# Patient Record
Sex: Male | Born: 1980 | Race: White | Hispanic: No | Marital: Single | Smoking: Current every day smoker
Health system: Southern US, Community
[De-identification: ages and names within clinical notes are randomized; demographics above are authoritative.]

## PROBLEM LIST (undated history)

## (undated) DIAGNOSIS — N200 Calculus of kidney: Secondary | ICD-10-CM

## (undated) HISTORY — PX: OTHER SURGICAL HISTORY: SHX169

---
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
## 2003-03-31 ENCOUNTER — Emergency Department (HOSPITAL_COMMUNITY): Admission: EM | Admit: 2003-03-31 | Discharge: 2003-04-01 | Payer: Self-pay | Admitting: *Deleted

## 2005-10-09 ENCOUNTER — Emergency Department (HOSPITAL_COMMUNITY): Admission: EM | Admit: 2005-10-09 | Discharge: 2005-10-09 | Payer: Self-pay | Admitting: Emergency Medicine

## 2006-03-14 ENCOUNTER — Emergency Department (HOSPITAL_COMMUNITY): Admission: EM | Admit: 2006-03-14 | Discharge: 2006-03-14 | Payer: Self-pay | Admitting: Emergency Medicine

## 2006-08-15 HISTORY — PX: WRIST FRACTURE SURGERY: SHX121

## 2007-04-22 ENCOUNTER — Emergency Department (HOSPITAL_COMMUNITY): Admission: EM | Admit: 2007-04-22 | Discharge: 2007-04-23 | Payer: Self-pay | Admitting: Emergency Medicine

## 2007-04-30 ENCOUNTER — Ambulatory Visit (HOSPITAL_BASED_OUTPATIENT_CLINIC_OR_DEPARTMENT_OTHER): Admission: RE | Admit: 2007-04-30 | Discharge: 2007-05-01 | Payer: Self-pay | Admitting: Orthopedic Surgery
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
## 2008-08-19 IMAGING — CR DG ELBOW COMPLETE 3+V*L*
4 series · 4 of 4 positions shown · non-contrast
Comparison: none

CLINICAL DATA: Wrist fracture.  Also with proximal pain. 
 LEFT FOREARM ? 2 VIEW:

[x elbow joint lat left]
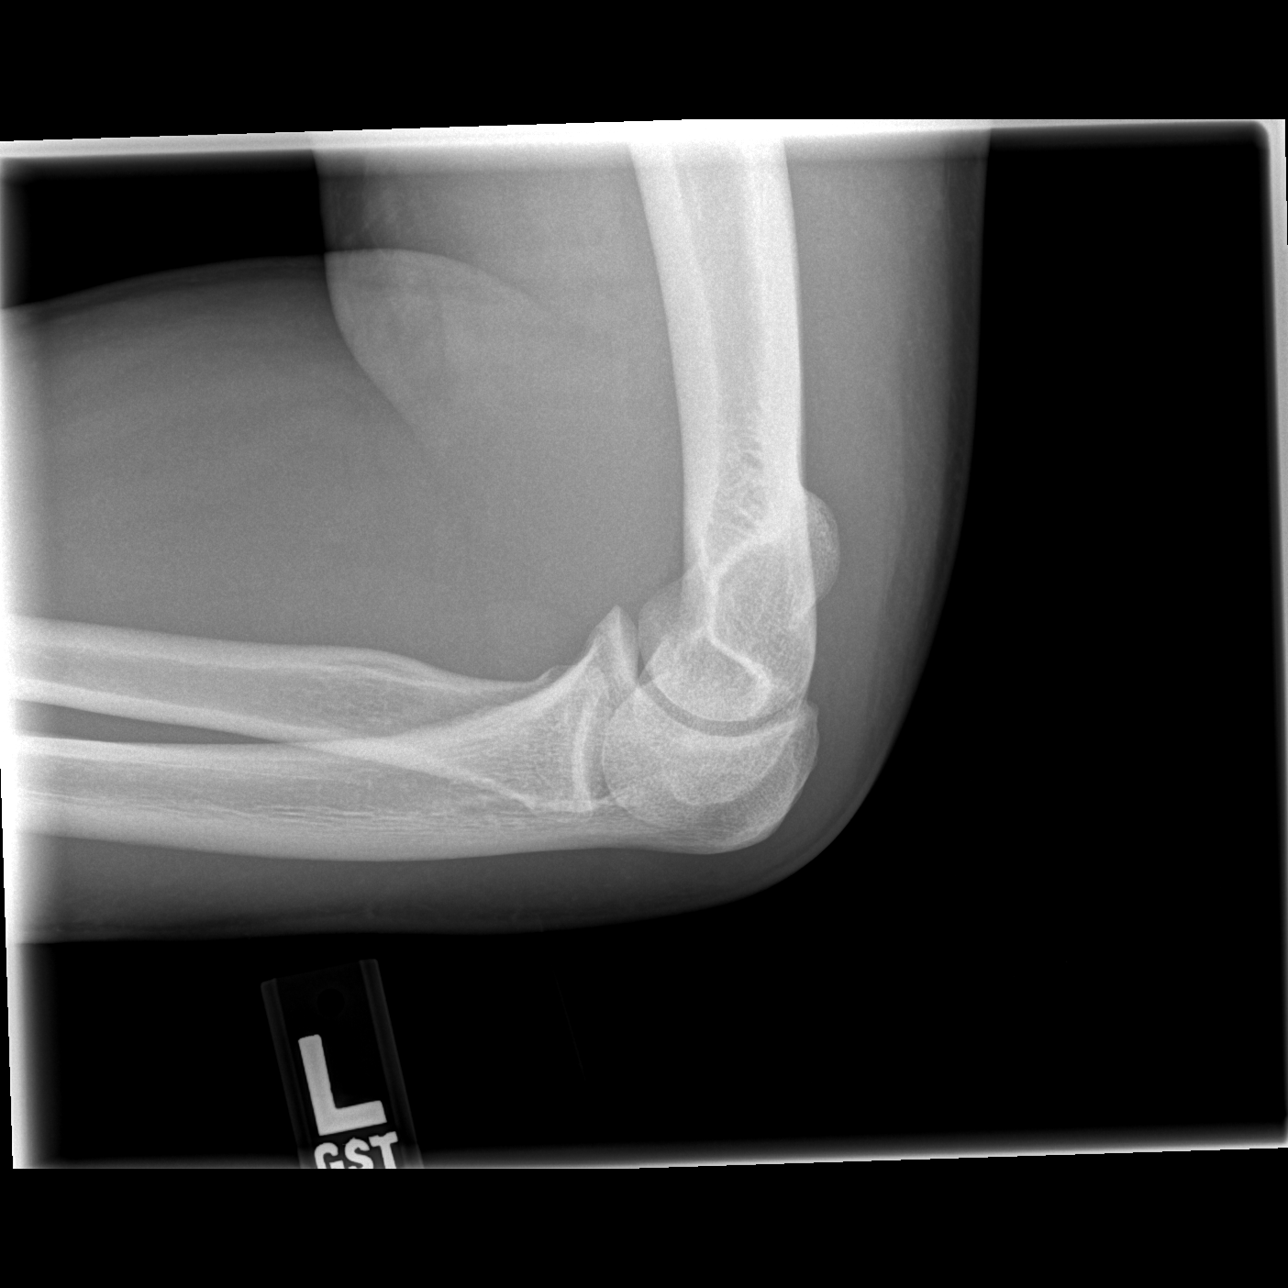

[t elbow (1 of 3)]
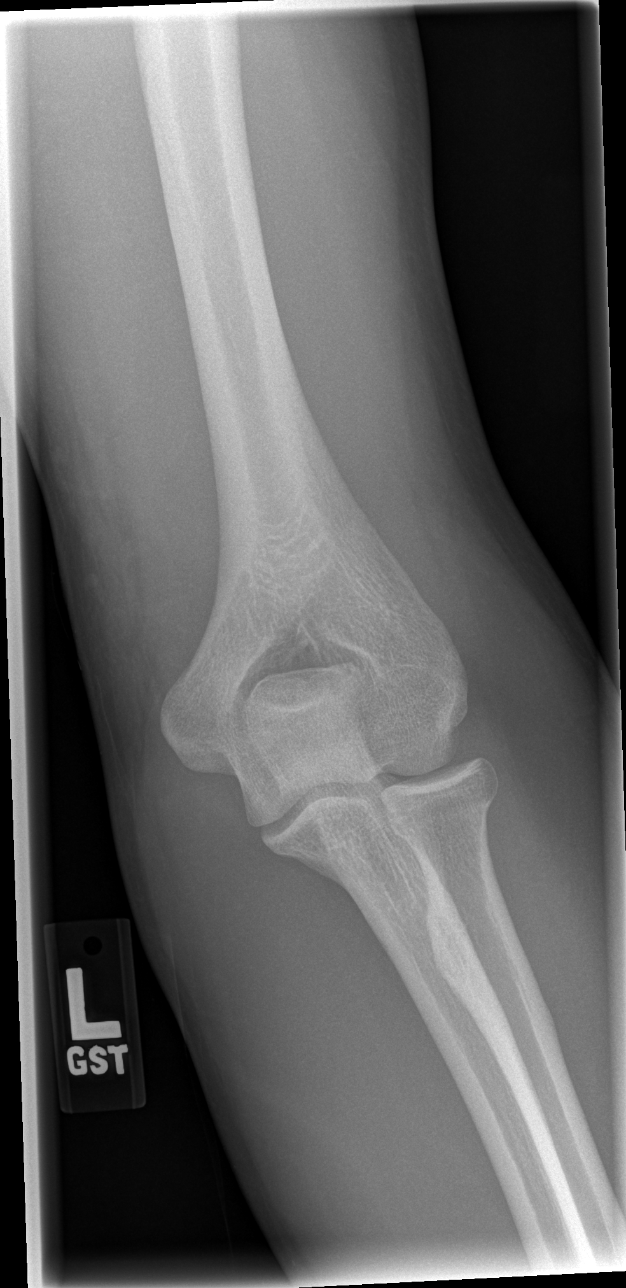

[t elbow (2 of 3)]
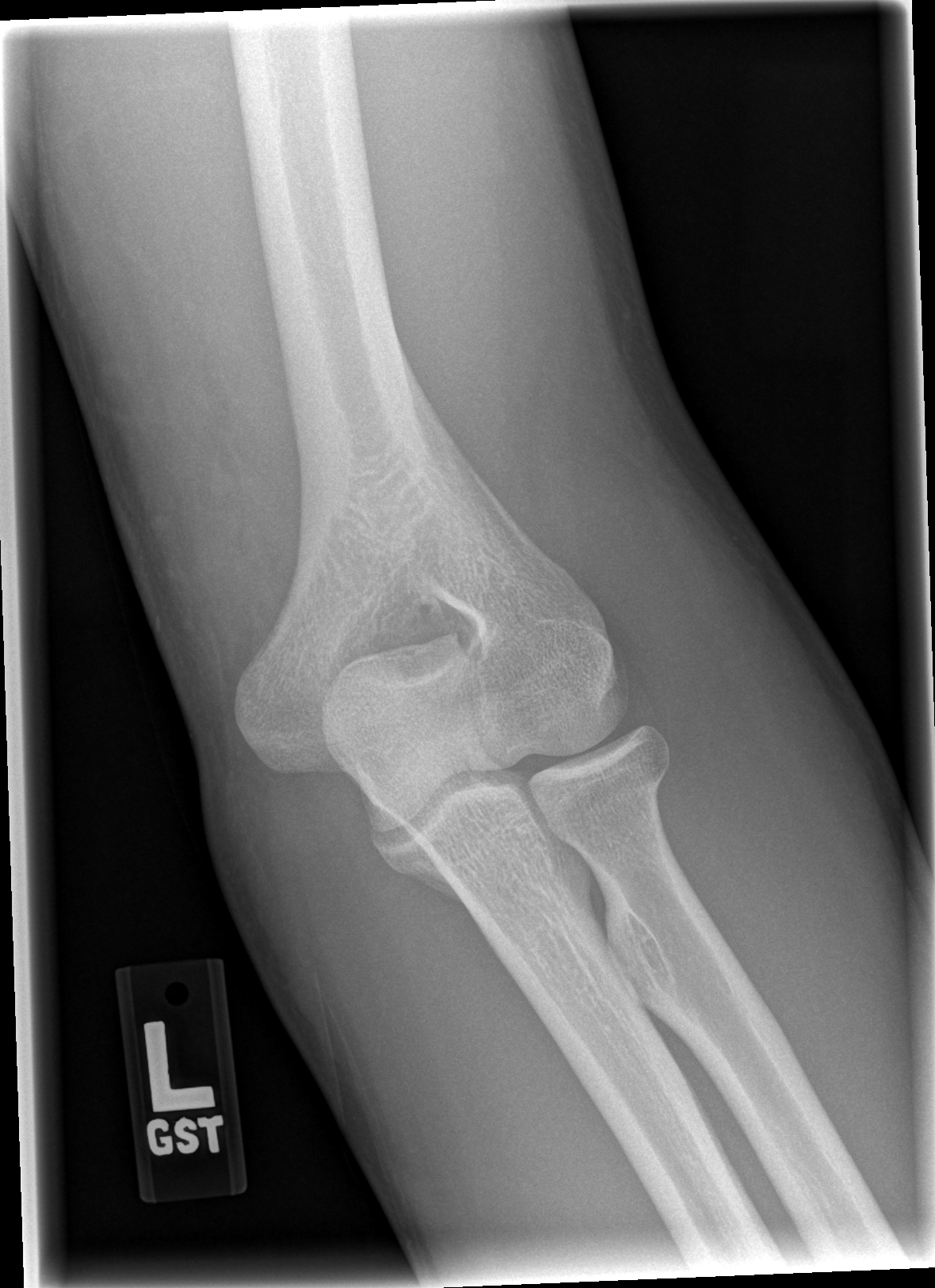

[t elbow (3 of 3)]
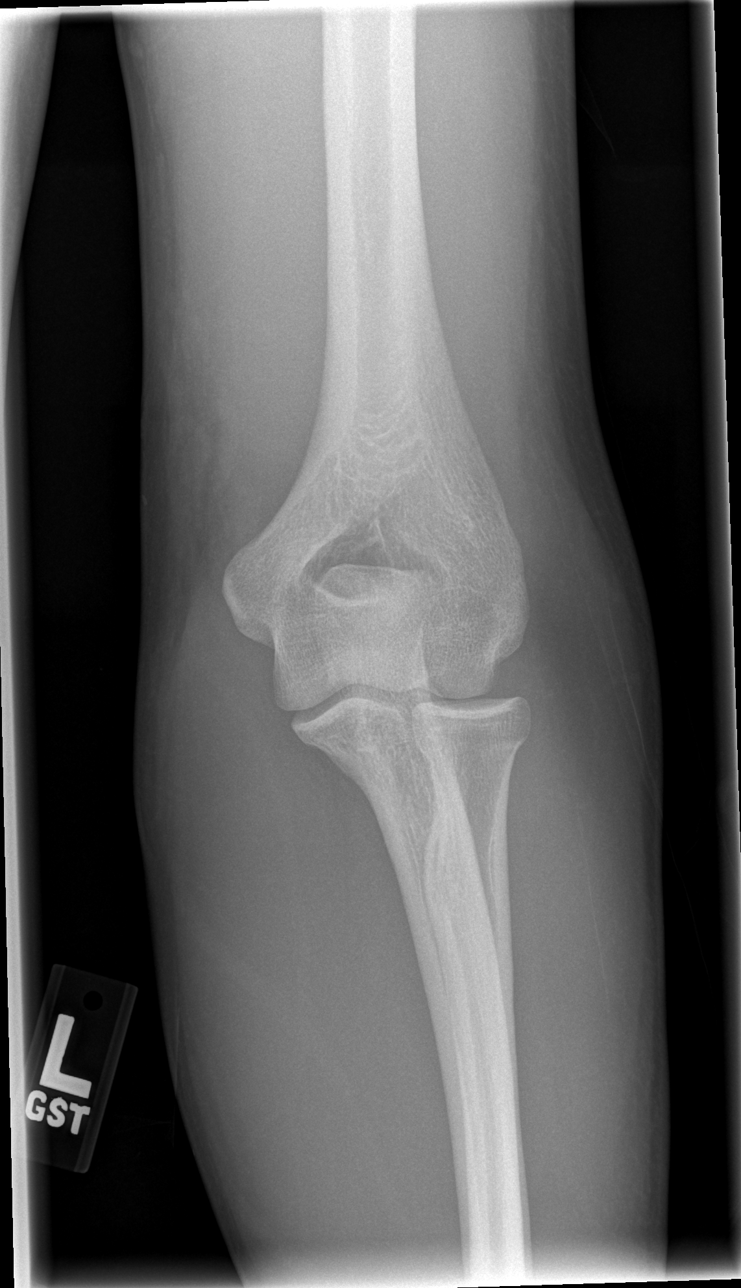

[4 of 4 positions shown; findings below may reference images not displayed]

FINDINGS: No abnormality seen proximal to the previously described distal radial and ulnar fractures.  Patient may have a small radiopaque foreign object in or on the skin dorsally as marked.
IMPRESSION: As discussed above.
 LEFT ELBOW ? 4 VIEW:
FINDINGS: No evidence of fracture, dislocation, or joint effusion.
IMPRESSION: Negative.

## 2010-12-28 NOTE — Op Note (Signed)
NAMERAYMUND, [name]                ACCOUNT NO.:  [id number]   MEDICAL RECORD NO.:  [id number]          PATIENT TYPE:  AMB   LOCATION:  DSC                          FACILITY:  [facility]   PHYSICIAN:  [name]. Weingold, M.D.DATE OF BIRTH:  01-Dec-1980   DATE OF PROCEDURE:  04/30/2007  DATE OF DISCHARGE:                               OPERATIVE REPORT   PREOPERATIVE DIAGNOSIS:  Displaced intra-articular fracture left distal  radius.   POSTOPERATIVE DIAGNOSIS:  Displaced intra-articular fracture left distal  radius.   PROCEDURE:  Open reduction internal fixation above using DVR plate and  screws as well as two additional of 0.045 K-wires and left carpal tunnel  release through separate incision.   SURGEON:  [name]. Mina Mata, M.D.   ASSISTANT:  None.   ANESTHESIA:  General.   TOURNIQUET TIME:  1 hour and 31 minutes.   COMPLICATIONS:  None.   DRAINS:  None.   OPERATIVE REPORT:  The patient was taken to operating suite after  induction of adequate general anesthesia, left upper extremity was  prepped in usual sterile fashion.  An Esmarch was used to exsanguinate  the limb.  Tourniquet was then inflated to 250 mm.  At this point in  time a standard FCR approach distal radius was undertaken.  The palpable  border the FCR tendon was identified.  The skin was incised over this  for 6 to 8 cm.  Interval between the FCR and radial artery was  identified and developed.  Dissection was carried down the level of  pronator quadratus.  Pronator quadratus was subperiosteally stripped off  the distal radius revealing a complex intra-articular fracture of distal  radius with severe comminution, intra-articular extension and  shortening.  Careful dissection of periosteum was done to reveal a  fracture site which was carefully reduced using longitudinal traction  and a Therapist, nutritional.  After this was done, the DVR standard plate was  fastened  to the lower aspect of the radius fastened to the  volar aspect  using the slotted screw hole.  Intraoperative fluoroscopy revealed  adequate reduction both the AP and lateral oblique view.  The remaining  cortical screws were placed followed by placement of seven smooth pegs  distally.  Intraoperative fluoroscopy revealed good reduction both the  AP lateral and oblique view.  There was some combination of the radial  styloid and also the radial shaft fracture.  These were fixed with  additional 0.045 K-wire driven percutaneously under direct and  fluoroscopic guidance.  These were cut outside the skin and bent upon  themselves.  After this was done, the wound was thoroughly irrigated.  It was loosely closed in layers of 0 Vicryl for the pronator quadratus  and 3-0 Prolene subcuticular stitch on the skin.  A second incision was  made in the palmar aspect of the left hand in line with the long finger  metacarpal starting Kaplan cardinal line.  Skin was incised 2 cm.  Palmar fascia was identified and clipped.  The distal edge of the  transverse carpal ligament repair was identified and split with a 15  blade median nerve was identified and protected with Therapist, nutritional.  Remaining aspects of the transverse carpal ligament were divided under  vision using curved blunt scissor.  The canal was inspected.  There  was blood that was evacuated out.  After this was done, it was loosely  closed with 3-0 Prolene subcuticular stitch.  Steri-Strips, 4x4s fluffs  and volar splint was applied.  The patient tolerated both procedures  well and went to recovery room in stable fashion.      [name] Mina Mata, M.D.  Electronically Signed     MAW/MEDQ  D:  04/30/2007  T:  05/01/2007  Job:  [id number]

## 2011-05-26 LAB — POCT HEMOGLOBIN-HEMACUE
Hemoglobin: 18.2 — ABNORMAL HIGH
Operator id: 116011

## 2011-05-27 LAB — I-STAT 8, (EC8 V) (CONVERTED LAB)
Acid-base deficit: 2
BUN: 16
Bicarbonate: 24
Chloride: 102
Glucose, Bld: 107 — ABNORMAL HIGH
HCT: 53 — ABNORMAL HIGH
Hemoglobin: 18 — ABNORMAL HIGH
Operator id: 277751
Potassium: 3.3 — ABNORMAL LOW
Sodium: 138
TCO2: 25
pCO2, Ven: 42.4 — ABNORMAL LOW
pH, Ven: 7.361 — ABNORMAL HIGH

## 2011-05-27 LAB — POCT I-STAT CREATININE
Creatinine, Ser: 1.1
Operator id: 277751

## 2011-05-27 LAB — ETHANOL: Alcohol, Ethyl (B): 42 — ABNORMAL HIGH

## 2012-09-12 ENCOUNTER — Emergency Department (HOSPITAL_COMMUNITY)
Admission: EM | Admit: 2012-09-12 | Discharge: 2012-09-12 | Disposition: A | Discharge status: Home / Self Care | Payer: Self-pay | Attending: Emergency Medicine | Admitting: Emergency Medicine

## 2012-09-12 ENCOUNTER — Encounter (HOSPITAL_COMMUNITY): Payer: Self-pay

## 2012-09-12 DIAGNOSIS — S61219A Laceration without foreign body of unspecified finger without damage to nail, initial encounter: Secondary | ICD-10-CM

## 2012-09-12 DIAGNOSIS — F17200 Nicotine dependence, unspecified, uncomplicated: Secondary | ICD-10-CM | POA: Insufficient documentation

## 2012-09-12 DIAGNOSIS — W268XXA Contact with other sharp object(s), not elsewhere classified, initial encounter: Secondary | ICD-10-CM | POA: Insufficient documentation

## 2012-09-12 DIAGNOSIS — Y929 Unspecified place or not applicable: Secondary | ICD-10-CM | POA: Insufficient documentation

## 2012-09-12 DIAGNOSIS — S61209A Unspecified open wound of unspecified finger without damage to nail, initial encounter: Secondary | ICD-10-CM | POA: Insufficient documentation

## 2012-09-12 DIAGNOSIS — Y93G1 Activity, food preparation and clean up: Secondary | ICD-10-CM | POA: Insufficient documentation

## 2012-09-12 MED ORDER — TETANUS-DIPHTH-ACELL PERTUSSIS 5-2.5-18.5 LF-MCG/0.5 IM SUSP: 0.5000 mL | Freq: Once | INTRAMUSCULAR | Status: DC

## 2012-09-12 NOTE — ED Provider Notes (Signed)
History   This chart was scribed for [name], non-physician practitioner, working with [name], MD by [name], ED Scribe. This patient was seen in room TR07C/TR07C and the patient's care was started at 2125.    CSN: [id number]  Arrival date & time 09/12/12  2014   First MD Initiated Contact with Patient 09/12/12 2125      Chief Complaint  Patient presents with  . Finger Injury    The history is provided by the patient. No language interpreter was used.   [name] is a 32 y.o. male who presents to the Emergency Department complaining of small, linear 2 cm laceration to his left index finger that occurred an hour ago. He states that he was cleaning dishes when a glass broke and he cut his hand. States the glass did not shatter and no pieces broke off. He rates his pain 1/10. He denies any changes in sensation and is able to move the finger. Bleeding is controlled here in ED. He reports his last tetanus was between 5/10 years ago.   History reviewed. No pertinent past medical history.  Past Surgical History  Procedure Date  . Arm surgery     No family history on file.  History  Substance Use Topics  . Smoking status: Current Every Day Smoker -- 0.5 packs/day  . Smokeless tobacco: Never Used  . Alcohol Use: No      Review of Systems  Skin: Positive for wound.  All other systems reviewed and are negative.    Allergies  Review of patient's allergies indicates no known allergies.  Home Medications  No current outpatient prescriptions on file.  BP 141/85  Pulse 81  Temp 98.3 F (36.8 C) (Oral)  Resp 15  SpO2 96%  Physical Exam  Nursing note and vitals reviewed. Constitutional: He is oriented to person, place, and time. He appears well-developed and well-nourished. No distress.  HENT:  Head: Normocephalic and atraumatic.  Eyes: Conjunctivae normal and EOM are normal.  Neck: Neck supple. No tracheal deviation present.   Cardiovascular: Normal rate, regular rhythm, normal heart sounds and intact distal pulses.   Pulmonary/Chest: Effort normal and breath sounds normal. No respiratory distress.  Musculoskeletal: Normal range of motion.  Neurological: He is alert and oriented to person, place, and time.  Skin: Skin is warm and dry.       2 cm laceration to the left index finger. 1 cm of the laceration is superficial, other cm is deeper. No tenderness or evidence of disruption. Neurovascularly intact. Good capillary refill.   Psychiatric: He has a normal mood and affect. His behavior is normal.    ED Course  Procedures (including critical care time) LACERATION REPAIR Performed by: Johnnette Gourd Authorized by: Johnnette Gourd Consent: Verbal consent obtained. Risks and benefits: risks, benefits and alternatives were discussed Consent given by: patient Patient identity confirmed: provided demographic data Prepped and Draped in normal sterile fashion Wound explored  Laceration Location: left index finger  Laceration Length: 2 cm  No Foreign Bodies seen or palpated  Anesthesia: local infiltration  Local anesthetic: lidocaine 2% with epinephrine  Anesthetic total: 5 ml  Irrigation method: syringe Amount of cleaning: standard  Skin closure: 4-0 prolene  Number of sutures: 8  Technique: simple interrupted  Patient tolerance: Patient tolerated the procedure well with no immediate complications.  DIAGNOSTIC STUDIES: Oxygen Saturation is 96% on room air, adequate by my interpretation.    COORDINATION OF CARE:  21:40-Discussed planned course of  treatment with the patient including a laceration repair, who is agreeable at this time.    Labs Reviewed - No data to display No results found.   1. Finger laceration       MDM  32 year old male with laceration to his finger. 8 sutures placed. No foreign bodies seen or palpated. Wound care given. Return precautions discussed. He will followup  with urgent care for suture removal. Neurovascularly intact. No evidence of tendinous disruption. Patient states understanding of plan and is agreeable.   I personally performed the services described in this documentation, which was scribed in my presence. The recorded information has been reviewed and is accurate.       Trevor Mace, PA-C 09/12/12 2223

## 2012-09-12 NOTE — ED Notes (Addendum)
Patient presents with laceration to left index finger. Was cleaning glass when it broke. Bleeding controlled at triage. Sensation intact. Last tetanus between 5-10 years ago.

## 2012-09-12 NOTE — ED Notes (Signed)
Pt refused Tetanus shot.Pt discharged.Vital signs stable and GCS 15

## 2012-09-12 NOTE — ED Provider Notes (Signed)
Medical screening examination/treatment/procedure(s) were performed by non-physician practitioner and as supervising physician I was immediately available for consultation/collaboration.   Dante Roudebush, MD 09/12/12 2330 

## 2015-01-18 ENCOUNTER — Emergency Department (HOSPITAL_COMMUNITY)
Admission: EM | Admit: 2015-01-18 | Discharge: 2015-01-18 | Disposition: A | Discharge status: Home / Self Care | Payer: Self-pay | Attending: Urology | Admitting: Urology

## 2015-01-18 ENCOUNTER — Emergency Department (HOSPITAL_COMMUNITY): Payer: Self-pay

## 2015-01-18 ENCOUNTER — Emergency Department (HOSPITAL_COMMUNITY): Payer: Self-pay | Admitting: Anesthesiology

## 2015-01-18 ENCOUNTER — Encounter (HOSPITAL_COMMUNITY): Payer: Self-pay | Admitting: Emergency Medicine

## 2015-01-18 ENCOUNTER — Encounter (HOSPITAL_COMMUNITY)
Admission: EM | Disposition: A | Discharge status: Home / Self Care | Payer: Self-pay | Source: Home / Self Care | Attending: Emergency Medicine

## 2015-01-18 DIAGNOSIS — Z87442 Personal history of urinary calculi: Secondary | ICD-10-CM | POA: Insufficient documentation

## 2015-01-18 DIAGNOSIS — N201 Calculus of ureter: Secondary | ICD-10-CM

## 2015-01-18 DIAGNOSIS — R109 Unspecified abdominal pain: Secondary | ICD-10-CM

## 2015-01-18 DIAGNOSIS — N23 Unspecified renal colic: Secondary | ICD-10-CM

## 2015-01-18 DIAGNOSIS — N132 Hydronephrosis with renal and ureteral calculous obstruction: Secondary | ICD-10-CM | POA: Insufficient documentation

## 2015-01-18 DIAGNOSIS — N200 Calculus of kidney: Secondary | ICD-10-CM

## 2015-01-18 DIAGNOSIS — F17200 Nicotine dependence, unspecified, uncomplicated: Secondary | ICD-10-CM | POA: Insufficient documentation

## 2015-01-18 DIAGNOSIS — N134 Hydroureter: Secondary | ICD-10-CM | POA: Insufficient documentation

## 2015-01-18 HISTORY — DX: Calculus of kidney: N20.0

## 2015-01-18 HISTORY — PX: CYSTOSCOPY W/ URETERAL STENT PLACEMENT: SHX1429

## 2015-01-18 LAB — CBC WITH DIFFERENTIAL/PLATELET
BASOS ABS: 0 10*3/uL (ref 0.0–0.1)
Basophils Relative: 0 % (ref 0–1)
EOS ABS: 0.1 10*3/uL (ref 0.0–0.7)
EOS PCT: 0 % (ref 0–5)
HCT: 45.3 % (ref 39.0–52.0)
HEMOGLOBIN: 15.2 g/dL (ref 13.0–17.0)
Lymphocytes Relative: 9 % — ABNORMAL LOW (ref 12–46)
Lymphs Abs: 1.3 10*3/uL (ref 0.7–4.0)
MCH: 30.7 pg (ref 26.0–34.0)
MCHC: 33.6 g/dL (ref 30.0–36.0)
MCV: 91.5 fL (ref 78.0–100.0)
MONO ABS: 1.1 10*3/uL — AB (ref 0.1–1.0)
MONOS PCT: 7 % (ref 3–12)
NEUTROS ABS: 12.3 10*3/uL — AB (ref 1.7–7.7)
Neutrophils Relative %: 84 % — ABNORMAL HIGH (ref 43–77)
PLATELETS: 234 10*3/uL (ref 150–400)
RBC: 4.95 MIL/uL (ref 4.22–5.81)
RDW: 13.5 % (ref 11.5–15.5)
WBC: 14.7 10*3/uL — AB (ref 4.0–10.5)

## 2015-01-18 LAB — COMPREHENSIVE METABOLIC PANEL
ALBUMIN: 4.5 g/dL (ref 3.5–5.0)
ALK PHOS: 55 U/L (ref 38–126)
ALT: 30 U/L (ref 17–63)
AST: 17 U/L (ref 15–41)
Anion gap: 9 (ref 5–15)
BILIRUBIN TOTAL: 0.8 mg/dL (ref 0.3–1.2)
BUN: 27 mg/dL — ABNORMAL HIGH (ref 6–20)
CO2: 26 mmol/L (ref 22–32)
Calcium: 9.2 mg/dL (ref 8.9–10.3)
Chloride: 106 mmol/L (ref 101–111)
Creatinine, Ser: 0.98 mg/dL (ref 0.61–1.24)
GFR calc Af Amer: >60 mL/min (ref >60)
GFR calc non Af Amer: >60 mL/min (ref >60)
Glucose, Bld: 146 mg/dL — ABNORMAL HIGH (ref 65–99)
Potassium: 4.1 mmol/L (ref 3.5–5.1)
Sodium: 141 mmol/L (ref 135–145)
Total Protein: 7.4 g/dL (ref 6.5–8.1)

## 2015-01-18 LAB — URINE MICROSCOPIC-ADD ON

## 2015-01-18 LAB — URINALYSIS, ROUTINE W REFLEX MICROSCOPIC
Bilirubin Urine: NEGATIVE
Glucose, UA: NEGATIVE mg/dL
Ketones, ur: NEGATIVE mg/dL
Nitrite: NEGATIVE
PH: 6 (ref 5.0–8.0)
PROTEIN: 30 mg/dL — AB
Specific Gravity, Urine: 1.034 — ABNORMAL HIGH (ref 1.005–1.030)
Urobilinogen, UA: 1 mg/dL (ref 0.0–1.0)

## 2015-01-18 SURGERY — CYSTOSCOPY, WITH RETROGRADE PYELOGRAM AND URETERAL STENT INSERTION
Anesthesia: General | Site: Ureter | Laterality: Left

## 2015-01-18 MED ORDER — MORPHINE SULFATE 4 MG/ML IJ SOLN
4.0000 mg | INTRAMUSCULAR | Status: AC | PRN
  Administered 2015-01-18 (×2): 4 mg via INTRAVENOUS
  Filled 2015-01-18 (×2): qty 1

## 2015-01-18 MED ORDER — CEFAZOLIN SODIUM-DEXTROSE 2-3 GM-% IV SOLR
INTRAVENOUS | Status: AC
  Filled 2015-01-18: qty 50

## 2015-01-18 MED ORDER — LACTATED RINGERS IV SOLN: INTRAVENOUS | Status: DC

## 2015-01-18 MED ORDER — TRIMETHOPRIM 100 MG PO TABS: 100.0000 mg | ORAL_TABLET | ORAL | Status: DC

## 2015-01-18 MED ORDER — MIDAZOLAM HCL 5 MG/5ML IJ SOLN
INTRAMUSCULAR | Status: DC | PRN
  Administered 2015-01-18: 2 mg via INTRAVENOUS

## 2015-01-18 MED ORDER — PROPOFOL 10 MG/ML IV BOLUS
INTRAVENOUS | Status: AC
  Filled 2015-01-18: qty 20

## 2015-01-18 MED ORDER — BELLADONNA ALKALOIDS-OPIUM 16.2-60 MG RE SUPP
RECTAL | Status: DC | PRN
  Administered 2015-01-18: 1 via RECTAL

## 2015-01-18 MED ORDER — ONDANSETRON HCL 4 MG/2ML IJ SOLN
4.0000 mg | INTRAMUSCULAR | Status: DC | PRN
  Administered 2015-01-18: 4 mg via INTRAVENOUS
  Filled 2015-01-18: qty 2

## 2015-01-18 MED ORDER — FENTANYL CITRATE (PF) 250 MCG/5ML IJ SOLN
INTRAMUSCULAR | Status: AC
  Filled 2015-01-18: qty 5

## 2015-01-18 MED ORDER — PROPOFOL 10 MG/ML IV BOLUS
INTRAVENOUS | Status: DC | PRN
  Administered 2015-01-18: 150 mg via INTRAVENOUS

## 2015-01-18 MED ORDER — DEXAMETHASONE SODIUM PHOSPHATE 10 MG/ML IJ SOLN
INTRAMUSCULAR | Status: DC | PRN
  Administered 2015-01-18: 10 mg via INTRAVENOUS

## 2015-01-18 MED ORDER — TRAMADOL-ACETAMINOPHEN 37.5-325 MG PO TABS: 1.0000 | ORAL_TABLET | Freq: Four times a day (QID) | ORAL | Status: AC | PRN

## 2015-01-18 MED ORDER — SODIUM CHLORIDE 0.9 % IV SOLN
INTRAVENOUS | Status: DC
  Administered 2015-01-18: 10:00:00 via INTRAVENOUS

## 2015-01-18 MED ORDER — KETOROLAC TROMETHAMINE 30 MG/ML IJ SOLN
INTRAMUSCULAR | Status: AC
  Filled 2015-01-18: qty 1

## 2015-01-18 MED ORDER — FENTANYL CITRATE (PF) 100 MCG/2ML IJ SOLN: 25.0000 ug | INTRAMUSCULAR | Status: DC | PRN

## 2015-01-18 MED ORDER — SUCCINYLCHOLINE CHLORIDE 20 MG/ML IJ SOLN
INTRAMUSCULAR | Status: DC | PRN
  Administered 2015-01-18: 100 mg via INTRAVENOUS

## 2015-01-18 MED ORDER — FENTANYL CITRATE (PF) 100 MCG/2ML IJ SOLN
50.0000 ug | INTRAMUSCULAR | Status: AC | PRN
  Administered 2015-01-18 (×2): 50 ug via INTRAVENOUS
  Filled 2015-01-18: qty 2

## 2015-01-18 MED ORDER — FENTANYL CITRATE (PF) 250 MCG/5ML IJ SOLN
INTRAMUSCULAR | Status: DC | PRN
  Administered 2015-01-18: 50 ug via INTRAVENOUS

## 2015-01-18 MED ORDER — ONDANSETRON HCL 4 MG/2ML IJ SOLN
INTRAMUSCULAR | Status: AC
  Filled 2015-01-18: qty 2

## 2015-01-18 MED ORDER — PHENAZOPYRIDINE HCL 200 MG PO TABS: 200.0000 mg | ORAL_TABLET | Freq: Three times a day (TID) | ORAL | Status: DC | PRN

## 2015-01-18 MED ORDER — MIDAZOLAM HCL 2 MG/2ML IJ SOLN
INTRAMUSCULAR | Status: AC
  Filled 2015-01-18: qty 2

## 2015-01-18 MED ORDER — 0.9 % SODIUM CHLORIDE (POUR BTL) OPTIME: TOPICAL | Status: DC | PRN

## 2015-01-18 MED ORDER — KETOROLAC TROMETHAMINE 30 MG/ML IJ SOLN
INTRAMUSCULAR | Status: DC | PRN
  Administered 2015-01-18: 30 mg via INTRAVENOUS

## 2015-01-18 MED ORDER — CEFAZOLIN SODIUM-DEXTROSE 2-3 GM-% IV SOLR
2.0000 g | Freq: Once | INTRAVENOUS | Status: AC
  Administered 2015-01-18: 2 g via INTRAVENOUS

## 2015-01-18 MED ORDER — BELLADONNA ALKALOIDS-OPIUM 16.2-60 MG RE SUPP
RECTAL | Status: AC
Start: 2015-01-18 — End: 2015-01-18
  Filled 2015-01-18: qty 1

## 2015-01-18 MED ORDER — LACTATED RINGERS IV SOLN
INTRAVENOUS | Status: DC | PRN
  Administered 2015-01-18: 14:00:00 via INTRAVENOUS

## 2015-01-18 MED ORDER — STERILE WATER FOR IRRIGATION IR SOLN
Status: DC | PRN
  Administered 2015-01-18: 3000 mL

## 2015-01-18 MED ORDER — DEXAMETHASONE SODIUM PHOSPHATE 10 MG/ML IJ SOLN
INTRAMUSCULAR | Status: AC
  Filled 2015-01-18: qty 1

## 2015-01-18 MED ORDER — TAMSULOSIN HCL 0.4 MG PO CAPS: 0.4000 mg | ORAL_CAPSULE | Freq: Every day | ORAL | Status: AC

## 2015-01-18 MED ORDER — IOHEXOL 300 MG/ML  SOLN
INTRAMUSCULAR | Status: DC | PRN
  Administered 2015-01-18: 10 mL

## 2015-01-18 MED ORDER — ONDANSETRON HCL 4 MG/2ML IJ SOLN
INTRAMUSCULAR | Status: DC | PRN
  Administered 2015-01-18: 4 mg via INTRAVENOUS

## 2015-01-18 SURGICAL SUPPLY — 14 items
BAG URO CATCHER STRL LF (DRAPE) ×3 IMPLANT
CATH INTERMIT  6FR 70CM (CATHETERS) ×3 IMPLANT
CLOTH BEACON ORANGE TIMEOUT ST (SAFETY) ×3 IMPLANT
GLOVE BIOGEL M STRL SZ7.5 (GLOVE) ×3 IMPLANT
GOWN STRL REUS W/TWL LRG LVL3 (GOWN DISPOSABLE) ×3 IMPLANT
GOWN STRL REUS W/TWL XL LVL3 (GOWN DISPOSABLE) ×3 IMPLANT
GUIDEWIRE STR DUAL SENSOR (WIRE) ×3 IMPLANT
MANIFOLD NEPTUNE II (INSTRUMENTS) ×3 IMPLANT
NS IRRIG 1000ML POUR BTL (IV SOLUTION) ×3 IMPLANT
PACK CYSTO (CUSTOM PROCEDURE TRAY) ×3 IMPLANT
SCRUB PCMX 4 OZ (MISCELLANEOUS) ×3 IMPLANT
STENT URET 6FRX24 CONTOUR (STENTS) ×3 IMPLANT
TUBING CONNECTING 10 (TUBING) ×2 IMPLANT
TUBING CONNECTING 10' (TUBING) ×1

## 2015-01-18 NOTE — ED Notes (Signed)
Pt reports L flank pain that began last night at 0430. Hx of kidney stones several years ago. Denies dysuria or blood in urine. No recent bending or straining.

## 2015-01-18 NOTE — Discharge Instructions (Signed)
Dietary Guidelines to Help Prevent Kidney Stones Your risk of kidney stones can be decreased by adjusting the foods you eat. The most important thing you can do is drink enough fluid. You should drink enough fluid to keep your urine clear or pale yellow. The following guidelines provide specific information for the type of kidney stone you have had. GUIDELINES ACCORDING TO TYPE OF KIDNEY STONE Calcium Oxalate Kidney Stones  Reduce the amount of salt you eat. Foods that have a lot of salt cause your body to release excess calcium into your urine. The excess calcium can combine with a substance called oxalate to form kidney stones.  Reduce the amount of animal protein you eat if the amount you eat is excessive. Animal protein causes your body to release excess calcium into your urine. Ask your dietitian how much protein from animal sources you should be eating.  Avoid foods that are high in oxalates. If you take vitamins, they should have less than 500 mg of vitamin C. Your body turns vitamin C into oxalates. You do not need to avoid fruits and vegetables high in vitamin C. Calcium Phosphate Kidney Stones  Reduce the amount of salt you eat to help prevent the release of excess calcium into your urine.  Reduce the amount of animal protein you eat if the amount you eat is excessive. Animal protein causes your body to release excess calcium into your urine. Ask your dietitian how much protein from animal sources you should be eating.  Get enough calcium from food or take a calcium supplement (ask your dietitian for recommendations). Food sources of calcium that do not increase your risk of kidney stones include:  Broccoli.  Dairy products, such as cheese and yogurt.  Pudding. Uric Acid Kidney Stones  Do not have more than 6 oz of animal protein per day. FOOD SOURCES Animal Protein Sources  Meat (all types).  Poultry.  Eggs.  Fish, seafood. Foods High in MirantSalt  Salt seasonings.  Soy  sauce.  Teriyaki sauce.  Cured and processed meats.  Salted crackers and snack foods.  Fast food.  Canned soups and most canned foods. Foods High in Oxalates  Grains:  Amaranth.  Barley.  Grits.  Wheat germ.  Bran.  Buckwheat flour.  All bran cereals.  Pretzels.  Whole wheat bread.  Vegetables:  Beans (wax).  Beets and beet greens.  Collard greens.  Eggplant.  Escarole.  Leeks.  Okra.  Parsley.  Rutabagas.  Spinach.  Swiss chard.  Tomato paste.  Fried potatoes.  Sweet potatoes.  Fruits:  Red currants.  Figs.  Kiwi.  Rhubarb.  Meat and Other Protein Sources:  Beans (dried).  Soy burgers and other soybean products.  Miso.  Nuts (peanuts, almonds, pecans, cashews, hazelnuts).  Nut butters.  Sesame seeds and tahini (paste made of sesame seeds).  Poppy seeds.  Beverages:  Chocolate drink mixes.  Soy milk.  Instant iced tea.  Juices made from high-oxalate fruits or vegetables.  Other:  Carob.  Chocolate.  Fruitcake.  Marmalades. Document Released: 11/26/2010 Document Revised: 08/06/2013 Document Reviewed: 06/28/2013 [facility] Patient Information 2015 [place], [place]. This information is not intended to replace advice given to you by your health care provider. Make sure you discuss any questions you have with your health care provider. STOP COLA DRINKS

## 2015-01-18 NOTE — H&P (View-Only) (Signed)
Urology Consult  Referring physician:   Dr. Marylou Flesher Reason for referral:  Kidney stone pain  Chief Complaint: back pain  History of Present Illness:   34 yo single male awoke this AM with L flank pain, and nausea, and vomiting  (x1). No gross hematuria.  No radiation of the pain to the RLQ or the right testicle. No traumal. No fever or chills. He admits to 4 cokes/day ( @ 16 oz each). Positive family hx for kidney stones ( uncle). Pt passed kidney stone spontaneously  Several years ago.   Past Medical History  Diagnosis Date  . Kidney stone    Past Surgical History  Procedure Laterality Date  . Arm surgery      Medications: I have reviewed the patient's current medications. Allergies: No Known Allergies  History reviewed. No pertinent family history. Social History:  reports that he has been smoking.  He has never used smokeless tobacco. He reports that he does not drink alcohol or use illicit drugs.  ROS: All systems are reviewed and negative except as noted. Sharp pain in the left back.   Physical Exam:  Vital signs in last 24 hours: Temp:  [98.6 F (37 C)] 98.6 F (37 C) (06/05 0818) Pulse Rate:  [60-67] 67 (06/05 1231) Resp:  [16-20] 16 (06/05 1231) BP: (122-123)/(78-87) 122/78 mmHg (06/05 1231) SpO2:  [95 %-99 %] 95 % (06/05 1231)  Cardiovascular: Skin warm; not flushed Respiratory: Breaths quiet; no shortness of breath Abdomen: No masses Neurological: Normal sensation to touch Musculoskeletal: Normal motor function arms and legs Lymphatics: No inguinal adenopathy Skin: No rashes Genitourinary: negative.   Laboratory Data:  Results for orders placed or performed during the hospital encounter of 01/18/15 (from the past 72 hour(s))  U/A (may I&O cath if menses)     Status: Abnormal   Collection Time: 01/18/15  8:14 AM  Result Value Ref Range   Color, Urine YELLOW YELLOW   APPearance CLOUDY (A) CLEAR   Specific Gravity, Urine 1.034 (H) 1.005 - 1.030   pH  6.0 5.0 - 8.0   Glucose, UA NEGATIVE NEGATIVE mg/dL   Hgb urine dipstick MODERATE (A) NEGATIVE   Bilirubin Urine NEGATIVE NEGATIVE   Ketones, ur NEGATIVE NEGATIVE mg/dL   Protein, ur 30 (A) NEGATIVE mg/dL   Urobilinogen, UA 1.0 0.0 - 1.0 mg/dL   Nitrite NEGATIVE NEGATIVE   Leukocytes, UA TRACE (A) NEGATIVE  Urine microscopic-add on     Status: Abnormal   Collection Time: 01/18/15  8:14 AM  Result Value Ref Range   Squamous Epithelial / LPF FEW (A) RARE   WBC, UA 7-10 <3 WBC/hpf   RBC / HPF 11-20 <3 RBC/hpf   Bacteria, UA FEW (A) RARE   Urine-Other MUCOUS PRESENT     Comment: AMORPHOUS URATES/PHOSPHATES  CBC with Differential     Status: Abnormal   Collection Time: 01/18/15  8:54 AM  Result Value Ref Range   WBC 14.7 (H) 4.0 - 10.5 K/uL   RBC 4.95 4.22 - 5.81 MIL/uL   Hemoglobin 15.2 13.0 - 17.0 g/dL   HCT 45.3 39.0 - 52.0 %   MCV 91.5 78.0 - 100.0 fL   MCH 30.7 26.0 - 34.0 pg   MCHC 33.6 30.0 - 36.0 g/dL   RDW 13.5 11.5 - 15.5 %   Platelets 234 150 - 400 K/uL   Neutrophils Relative % 84 (H) 43 - 77 %   Neutro Abs 12.3 (H) 1.7 - 7.7 K/uL   Lymphocytes Relative  9 (L) 12 - 46 %   Lymphs Abs 1.3 0.7 - 4.0 K/uL   Monocytes Relative 7 3 - 12 %   Monocytes Absolute 1.1 (H) 0.1 - 1.0 K/uL   Eosinophils Relative 0 0 - 5 %   Eosinophils Absolute 0.1 0.0 - 0.7 K/uL   Basophils Relative 0 0 - 1 %   Basophils Absolute 0.0 0.0 - 0.1 K/uL  Comprehensive metabolic panel     Status: Abnormal   Collection Time: 01/18/15  8:54 AM  Result Value Ref Range   Sodium 141 135 - 145 mmol/L   Potassium 4.1 3.5 - 5.1 mmol/L   Chloride 106 101 - 111 mmol/L   CO2 26 22 - 32 mmol/L   Glucose, Bld 146 (H) 65 - 99 mg/dL   BUN 27 (H) 6 - 20 mg/dL   Creatinine, Ser 0.98 0.61 - 1.24 mg/dL   Calcium 9.2 8.9 - 10.3 mg/dL   Total Protein 7.4 6.5 - 8.1 g/dL   Albumin 4.5 3.5 - 5.0 g/dL   AST 17 15 - 41 U/L   ALT 30 17 - 63 U/L   Alkaline Phosphatase 55 38 - 126 U/L   Total Bilirubin 0.8 0.3 - 1.2  mg/dL   GFR calc non Af Amer >60 >60 mL/min   GFR calc Af Amer >60 >60 mL/min    Comment: (NOTE) The eGFR has been calculated using the CKD EPI equation. This calculation has not been validated in all clinical situations. eGFR's persistently <60 mL/min signify possible Chronic Kidney Disease.    Anion gap 9 5 - 15   No results found for this or any previous visit (from the past 240 hour(s)). Creatinine:  Recent Labs  01/18/15 0854  CREATININE 0.98    Xrays: CLINICAL DATA: Left flank pain which began at 4:30 a.m. History of renal calculi.  EXAM: CT ABDOMEN AND PELVIS WITHOUT CONTRAST  TECHNIQUE: Multidetector CT imaging of the abdomen and pelvis was performed following the standard protocol without IV contrast.  COMPARISON: 03/14/2006  FINDINGS: Lower chest: Lung bases are clear.  Hepatobiliary: No focal hepatic lesion. No biliary duct dilatation. Gallbladder is normal. Common bile duct is normal.  Pancreas: Pancreas is normal. No ductal dilatation. No pancreatic inflammation.  Spleen: Normal spleen.  Adrenals/urinary tract: Adrenal glands are normal.  Hydronephrosis and hydroureter of the left renal collecting system secondary to a 11 mm obstructing calculus at the left ureteropelvic junction. This large calculus is readily evident on the CT topogram.  There are approximately 6 additional calculi within the lower pole of the for left kidney. A single small calculus within the cortex of the right kidney. No ureterolithiasis. No bladder calculi.  Stomach/Bowel: Stomach, small bowel, appendix, and cecum are normal. The colon and rectosigmoid colon are normal.  Vascular/Lymphatic: Abdominal aorta is normal caliber. There is no retroperitoneal or periportal lymphadenopathy. No pelvic lymphadenopathy.  Reproductive: Prostate normal  Musculoskeletal: No aggressive osseous lesion.  Other: No free fluid.  IMPRESSION: 1. Large obstructing  calculus at the left ureteropelvic junction with hydronephrosis. 2. Bilateral nephrolithiasis.   Electronically Signed  By: Suzy Bouchard M.D.  On: 01/18/2015 10:12        Impression/Assessment:   Continued L renal colic, despite meds. Pt needs JJ stent.   Plan:    Cysto, L JJ stent this afternoon  Johnpaul Gillentine I Umar Patmon 01/18/2015, 12:59 PM

## 2015-01-18 NOTE — Anesthesia Preprocedure Evaluation (Addendum)
Anesthesia Evaluation  Patient identified by MRN, date of birth, ID band Patient awake    Reviewed: Allergy & Precautions, H&P , NPO status , Patient's Chart, lab work & pertinent test results  Airway Mallampati: II  TM Distance: >3 FB Neck ROM: full    Dental no notable dental hx. (+) Teeth Intact, Dental Advisory Given   Pulmonary Current Smoker,  breath sounds clear to auscultation  Pulmonary exam normal       Cardiovascular Exercise Tolerance: Good negative cardio ROS Normal cardiovascular examRhythm:regular Rate:Normal     Neuro/Psych negative neurological ROS  negative psych ROS   GI/Hepatic negative GI ROS, Neg liver ROS,   Endo/Other  negative endocrine ROS  Renal/GU negative Renal ROS  negative genitourinary   Musculoskeletal   Abdominal   Peds  Hematology negative hematology ROS (+)   Anesthesia Other Findings   Reproductive/Obstetrics negative OB ROS                            Anesthesia Physical Anesthesia Plan  ASA: II  Anesthesia Plan: General   Post-op Pain Management:    Induction: Intravenous  Airway Management Planned: Oral ETT  Additional Equipment:   Intra-op Plan:   Post-operative Plan: Extubation in OR  Informed Consent: I have reviewed the patients History and Physical, chart, labs and discussed the procedure including the risks, benefits and alternatives for the proposed anesthesia with the patient or authorized representative who has indicated his/her understanding and acceptance.   Dental Advisory Given  Plan Discussed with: CRNA and Surgeon  Anesthesia Plan Comments:        Anesthesia Quick Evaluation

## 2015-01-18 NOTE — Interval H&P Note (Signed)
History and Physical Interval Note:  01/18/2015 3:03 PM  [name]  has presented today for surgery, with the diagnosis of left ureteral stone  The various methods of treatment have been discussed with the patient and family. After consideration of risks, benefits and other options for treatment, the patient has consented to  Procedure(s): CYSTOSCOPY WITH RETROGRADE PYELOGRAM/URETERAL STENT PLACEMENT (Left) as a surgical intervention .  The patient's history has been reviewed, patient examined, no change in status, stable for surgery.  I have reviewed the patient's chart and labs.  Questions were answered to the patient's satisfaction.    L arm marked.    Elias Dennington I Binyomin Brann

## 2015-01-18 NOTE — Transfer of Care (Signed)
Immediate Anesthesia Transfer of Care Note  Patient: Brent SnellShawn Franchini  Procedure(s) Performed: Procedure(s): CYSTOSCOPY WITH RETROGRADE PYELOGRAM/URETERAL STENT PLACEMENT (Left)  Patient Location: PACU  Anesthesia Type:General  Level of Consciousness: awake, sedated and patient cooperative  Airway & Oxygen Therapy: Patient Spontanous Breathing and Patient connected to face mask oxygen  Post-op Assessment: Report given to RN and Post -op Vital signs reviewed and stable  Post vital signs: Reviewed and stable  Last Vitals:  Filed Vitals:   01/18/15 1231  BP: 122/78  Pulse: 67  Temp:   Resp: 16    Complications: No apparent anesthesia complications

## 2015-01-18 NOTE — Consult Note (Signed)
Urology Consult  Referring physician:   Dr. Kathleen McMan Reason for referral:  Kidney stone pain  Chief Complaint: back pain  History of Present Illness:   34 yo single male awoke this AM with L flank pain, and nausea, and vomiting  (x1). No gross hematuria.  No radiation of the pain to the RLQ or the right testicle. No traumal. No fever or chills. He admits to 4 cokes/day ( @ 16 oz each). Positive family hx for kidney stones ( uncle). Pt passed kidney stone spontaneously  Several years ago.   Past Medical History  Diagnosis Date  . Kidney stone    Past Surgical History  Procedure Laterality Date  . Arm surgery      Medications: I have reviewed the patient's current medications. Allergies: No Known Allergies  History reviewed. No pertinent family history. Social History:  reports that he has been smoking.  He has never used smokeless tobacco. He reports that he does not drink alcohol or use illicit drugs.  ROS: All systems are reviewed and negative except as noted. Sharp pain in the left back.   Physical Exam:  Vital signs in last 24 hours: Temp:  [98.6 F (37 C)] 98.6 F (37 C) (06/05 0818) Pulse Rate:  [60-67] 67 (06/05 1231) Resp:  [16-20] 16 (06/05 1231) BP: (122-123)/(78-87) 122/78 mmHg (06/05 1231) SpO2:  [95 %-99 %] 95 % (06/05 1231)  Cardiovascular: Skin warm; not flushed Respiratory: Breaths quiet; no shortness of breath Abdomen: No masses Neurological: Normal sensation to touch Musculoskeletal: Normal motor function arms and legs Lymphatics: No inguinal adenopathy Skin: No rashes Genitourinary: negative.   Laboratory Data:  Results for orders placed or performed during the hospital encounter of 01/18/15 (from the past 72 hour(s))  U/A (may I&O cath if menses)     Status: Abnormal   Collection Time: 01/18/15  8:14 AM  Result Value Ref Range   Color, Urine YELLOW YELLOW   APPearance CLOUDY (A) CLEAR   Specific Gravity, Urine 1.034 (H) 1.005 - 1.030   pH  6.0 5.0 - 8.0   Glucose, UA NEGATIVE NEGATIVE mg/dL   Hgb urine dipstick MODERATE (A) NEGATIVE   Bilirubin Urine NEGATIVE NEGATIVE   Ketones, ur NEGATIVE NEGATIVE mg/dL   Protein, ur 30 (A) NEGATIVE mg/dL   Urobilinogen, UA 1.0 0.0 - 1.0 mg/dL   Nitrite NEGATIVE NEGATIVE   Leukocytes, UA TRACE (A) NEGATIVE  Urine microscopic-add on     Status: Abnormal   Collection Time: 01/18/15  8:14 AM  Result Value Ref Range   Squamous Epithelial / LPF FEW (A) RARE   WBC, UA 7-10 <3 WBC/hpf   RBC / HPF 11-20 <3 RBC/hpf   Bacteria, UA FEW (A) RARE   Urine-Other MUCOUS PRESENT     Comment: AMORPHOUS URATES/PHOSPHATES  CBC with Differential     Status: Abnormal   Collection Time: 01/18/15  8:54 AM  Result Value Ref Range   WBC 14.7 (H) 4.0 - 10.5 K/uL   RBC 4.95 4.22 - 5.81 MIL/uL   Hemoglobin 15.2 13.0 - 17.0 g/dL   HCT 45.3 39.0 - 52.0 %   MCV 91.5 78.0 - 100.0 fL   MCH 30.7 26.0 - 34.0 pg   MCHC 33.6 30.0 - 36.0 g/dL   RDW 13.5 11.5 - 15.5 %   Platelets 234 150 - 400 K/uL   Neutrophils Relative % 84 (H) 43 - 77 %   Neutro Abs 12.3 (H) 1.7 - 7.7 K/uL   Lymphocytes Relative   9 (L) 12 - 46 %   Lymphs Abs 1.3 0.7 - 4.0 K/uL   Monocytes Relative 7 3 - 12 %   Monocytes Absolute 1.1 (H) 0.1 - 1.0 K/uL   Eosinophils Relative 0 0 - 5 %   Eosinophils Absolute 0.1 0.0 - 0.7 K/uL   Basophils Relative 0 0 - 1 %   Basophils Absolute 0.0 0.0 - 0.1 K/uL  Comprehensive metabolic panel     Status: Abnormal   Collection Time: 01/18/15  8:54 AM  Result Value Ref Range   Sodium 141 135 - 145 mmol/L   Potassium 4.1 3.5 - 5.1 mmol/L   Chloride 106 101 - 111 mmol/L   CO2 26 22 - 32 mmol/L   Glucose, Bld 146 (H) 65 - 99 mg/dL   BUN 27 (H) 6 - 20 mg/dL   Creatinine, Ser 0.98 0.61 - 1.24 mg/dL   Calcium 9.2 8.9 - 10.3 mg/dL   Total Protein 7.4 6.5 - 8.1 g/dL   Albumin 4.5 3.5 - 5.0 g/dL   AST 17 15 - 41 U/L   ALT 30 17 - 63 U/L   Alkaline Phosphatase 55 38 - 126 U/L   Total Bilirubin 0.8 0.3 - 1.2  mg/dL   GFR calc non Af Amer >60 >60 mL/min   GFR calc Af Amer >60 >60 mL/min    Comment: (NOTE) The eGFR has been calculated using the CKD EPI equation. This calculation has not been validated in all clinical situations. eGFR's persistently <60 mL/min signify possible Chronic Kidney Disease.    Anion gap 9 5 - 15   No results found for this or any previous visit (from the past 240 hour(s)). Creatinine:  Recent Labs  01/18/15 0854  CREATININE 0.98    Xrays: CLINICAL DATA: Left flank pain which began at 4:30 a.m. History of renal calculi.  EXAM: CT ABDOMEN AND PELVIS WITHOUT CONTRAST  TECHNIQUE: Multidetector CT imaging of the abdomen and pelvis was performed following the standard protocol without IV contrast.  COMPARISON: 03/14/2006  FINDINGS: Lower chest: Lung bases are clear.  Hepatobiliary: No focal hepatic lesion. No biliary duct dilatation. Gallbladder is normal. Common bile duct is normal.  Pancreas: Pancreas is normal. No ductal dilatation. No pancreatic inflammation.  Spleen: Normal spleen.  Adrenals/urinary tract: Adrenal glands are normal.  Hydronephrosis and hydroureter of the left renal collecting system secondary to a 11 mm obstructing calculus at the left ureteropelvic junction. This large calculus is readily evident on the CT topogram.  There are approximately 6 additional calculi within the lower pole of the for left kidney. A single small calculus within the cortex of the right kidney. No ureterolithiasis. No bladder calculi.  Stomach/Bowel: Stomach, small bowel, appendix, and cecum are normal. The colon and rectosigmoid colon are normal.  Vascular/Lymphatic: Abdominal aorta is normal caliber. There is no retroperitoneal or periportal lymphadenopathy. No pelvic lymphadenopathy.  Reproductive: Prostate normal  Musculoskeletal: No aggressive osseous lesion.  Other: No free fluid.  IMPRESSION: 1. Large obstructing  calculus at the left ureteropelvic junction with hydronephrosis. 2. Bilateral nephrolithiasis.   Electronically Signed  By: Stewart Edmunds M.D.  On: 01/18/2015 10:12        Impression/Assessment:   Continued L renal colic, despite meds. Pt needs JJ stent.   Plan:    Cysto, L JJ stent this afternoon  Saud Bail I Ranata Laughery 01/18/2015, 12:59 PM      

## 2015-01-18 NOTE — Op Note (Signed)
Pre-operative diagnosis :  11mm impacted Left UPJ stone with unremitting renal colic  Postoperative diagnosis:  same  Operation:  Cysto, Left retrograde pyelogram with interpretation, L J stent, 22F x 24 cm.   Surgeon:  Kathie RhodesS. Patsi Searsannenbaum, MD  First assistant:  none  Anesthesia:  General LMA  Preparation: After appropriate pre-anesthesia, the patient was brought to the operating room, placed on the operating table in the dorsal supine position where general LMA anesthesia was introduced. He was then replaced in the dorsal lithotomy position with the pubis was prepped with Betadine solution and draped in usual fashion. The history was double checked. The CT scan was reviewed. The hand and left flank were previously marked.  Review history:    History of Present Illness: 34 yo single male awoke this AM with L flank pain, and nausea, and vomiting (x1). No gross hematuria. No radiation of the pain to the RLQ or the right testicle. No traumal. No fever or chills. He admits to 4 cokes/day ( @ 16 oz each). Positive family hx for kidney stones ( uncle). Pt passed kidney stone spontaneously Several years ago.   Past Medical History  Diagnosis Date  . Kidney stone         Statement of  Likelihood of Success: Excellent. TIME-OUT observed.:  Procedure:  Cystourethroscopy, as, showing normal-appearing urethra, and normal bladder based. The trigone was normal. The left ureteral orifice was identified. It was cannulated easily with a 6 [place] open-ended catheter. Left retrogram pyelogram showed normal contour of the left ureter. An 11 mm stone was identified which was impacted at the left UPJ, and this was documented by radiograph. A 0.038 guidewire was then passed through the open-ended catheter, into the renal pelvis, and dislodged the stone, into the renal pelvis. The wire was coiled in the renal pelvis. Over this, a 6 [place] by 24 similar left double-J stent was passed, with suture removed.  The patient received IV Toradol. He was awakened, and taken to recovery room in good condition. He received 2 g of IV Ancef.

## 2015-01-18 NOTE — ED Notes (Signed)
Patient transported to CT 

## 2015-01-18 NOTE — ED Notes (Signed)
Pt made aware we need a urine sample. Pt rude and cussing when asked about giving a sample. Person in the room with pt is pleasant.

## 2015-01-18 NOTE — Anesthesia Procedure Notes (Signed)
Procedure Name: Intubation Date/Time: 01/18/2015 2:40 PM Performed by: Delphia GratesHANDLER, Mykhia Danish Pre-anesthesia Checklist: Patient identified, Emergency Drugs available, Suction available and Patient being monitored Patient Re-evaluated:Patient Re-evaluated prior to inductionOxygen Delivery Method: Circle system utilized Preoxygenation: Pre-oxygenation with 100% oxygen Intubation Type: IV induction and Rapid sequence Laryngoscope Size: Mac and 4 Grade View: Grade I Tube type: Oral Tube size: 7.5 mm Number of attempts: 1 Airway Equipment and Method: Stylet Secured at: 22 cm Tube secured with: Tape Dental Injury: Teeth and Oropharynx as per pre-operative assessment

## 2015-01-18 NOTE — ED Provider Notes (Signed)
CSN: [id number]     Arrival date & time 01/18/15  0805 History   First MD Initiated Contact with Patient 01/18/15 [phone]     Chief Complaint  Patient presents with  . Flank Pain      HPI Pt was seen at 0910. Per pt, c/o sudden onset and persistence of waxing and waning left sided flank "pain" that began overnight last night approximately 0300.  Pt describes the pain as "like my last kidney stone," and radiating into the left side of his abd.  Has been associated with multiple intermittent episodes of N/V.  Denies testicular pain/swelling, no dysuria/hematuria, no abd pain, no diarrhea, no black or blood in emesis, no CP/SOB.     Past Medical History  Diagnosis Date  . Kidney stone    Past Surgical History  Procedure Laterality Date  . Arm surgery      History  Substance Use Topics  . Smoking status: Current Every Day Smoker -- 0.50 packs/day  . Smokeless tobacco: Never Used  . Alcohol Use: No    Review of Systems ROS: Statement: All systems negative except as marked or noted in the HPI; Constitutional: Negative for fever and chills. ; ; Eyes: Negative for eye pain, redness and discharge. ; ; ENMT: Negative for ear pain, hoarseness, nasal congestion, sinus pressure and sore throat. ; ; Cardiovascular: Negative for chest pain, palpitations, diaphoresis, dyspnea and peripheral edema. ; ; Respiratory: Negative for cough, wheezing and stridor. ; ; Gastrointestinal: +N/V. Negative for diarrhea, abdominal pain, blood in stool, hematemesis, jaundice and rectal bleeding. . ; ; Genitourinary: +flank pain. Negative for dysuria and hematuria. ; ; Genital:  No penile drainage or rash, no testicular pain or swelling, no scrotal rash or swelling. ;; Musculoskeletal: Negative for back pain and neck pain. Negative for swelling and trauma.; ; Skin: Negative for pruritus, rash, abrasions, blisters, bruising and skin lesion.; ; Neuro: Negative for headache, lightheadedness and neck stiffness. Negative for  weakness, altered level of consciousness , altered mental status, extremity weakness, paresthesias, involuntary movement, seizure and syncope.     Allergies  Review of patient's allergies indicates no known allergies.  Home Medications   Prior to Admission medications   Not on File   BP 123/87 mmHg  Pulse 60  Temp(Src) 98.6 F (37 C) (Oral)  Resp 20  SpO2 99% Physical Exam  0915: Physical examination:  Nursing notes reviewed; Vital signs and O2 SAT reviewed;  Constitutional: Well developed, Well nourished, Well hydrated, Uncomfortable appearing.;; Head:  Normocephalic, atraumatic; Eyes: EOMI, PERRL, No scleral icterus; ENMT: Mouth and pharynx normal, Mucous membranes moist; Neck: Supple, Full range of motion, No lymphadenopathy; Cardiovascular: Regular rate and rhythm, No murmur, rub, or gallop; Respiratory: Breath sounds clear & equal bilaterally, No rales, rhonchi, wheezes.  Speaking full sentences with ease, Normal respiratory effort/excursion; Chest: Nontender, Movement normal; Abdomen: Soft, Nontender, Nondistended, Normal bowel sounds; Genitourinary: No CVA tenderness; Spine:  No midline CS, TS, LS tenderness. +mild TTP left lumbar paraspinal muscles.;; Extremities: Pulses normal, No tenderness, No edema, No calf edema or asymmetry.; Neuro: AA&Ox3, Major CN grossly intact.  Speech clear. No gross focal motor or sensory deficits in extremities. Climbs on and off stretcher easily by himself. Gait steady. Pt has sat himself on the floor during exam.; Skin: Color normal, Warm, Dry.   ED Course  Procedures     EKG Interpretation None      MDM  MDM Reviewed: nursing note and vitals Interpretation: labs and CT scan  Results for orders placed or performed during the hospital encounter of 01/18/15  U/A (may I&O cath if menses)  Result Value Ref Range   Color, Urine YELLOW YELLOW   APPearance CLOUDY (A) CLEAR   Specific Gravity, Urine 1.034 (H) 1.005 - 1.030   pH 6.0 5.0 - 8.0    Glucose, UA NEGATIVE NEGATIVE mg/dL   Hgb urine dipstick MODERATE (A) NEGATIVE   Bilirubin Urine NEGATIVE NEGATIVE   Ketones, ur NEGATIVE NEGATIVE mg/dL   Protein, ur 30 (A) NEGATIVE mg/dL   Urobilinogen, UA 1.0 0.0 - 1.0 mg/dL   Nitrite NEGATIVE NEGATIVE   Leukocytes, UA TRACE (A) NEGATIVE  CBC with Differential  Result Value Ref Range   WBC 14.7 (H) 4.0 - 10.5 K/uL   RBC 4.95 4.22 - 5.81 MIL/uL   Hemoglobin 15.2 13.0 - 17.0 g/dL   HCT 16.1 09.6 - 04.5 %   MCV 91.5 78.0 - 100.0 fL   MCH 30.7 26.0 - 34.0 pg   MCHC 33.6 30.0 - 36.0 g/dL   RDW 40.9 81.1 - 91.4 %   Platelets 234 150 - 400 K/uL   Neutrophils Relative % 84 (H) 43 - 77 %   Neutro Abs 12.3 (H) 1.7 - 7.7 K/uL   Lymphocytes Relative 9 (L) 12 - 46 %   Lymphs Abs 1.3 0.7 - 4.0 K/uL   Monocytes Relative 7 3 - 12 %   Monocytes Absolute 1.1 (H) 0.1 - 1.0 K/uL   Eosinophils Relative 0 0 - 5 %   Eosinophils Absolute 0.1 0.0 - 0.7 K/uL   Basophils Relative 0 0 - 1 %   Basophils Absolute 0.0 0.0 - 0.1 K/uL  Comprehensive metabolic panel  Result Value Ref Range   Sodium 141 135 - 145 mmol/L   Potassium 4.1 3.5 - 5.1 mmol/L   Chloride 106 101 - 111 mmol/L   CO2 26 22 - 32 mmol/L   Glucose, Bld 146 (H) 65 - 99 mg/dL   BUN 27 (H) 6 - 20 mg/dL   Creatinine, Ser 7.82 0.61 - 1.24 mg/dL   Calcium 9.2 8.9 - 95.6 mg/dL   Total Protein 7.4 6.5 - 8.1 g/dL   Albumin 4.5 3.5 - 5.0 g/dL   AST 17 15 - 41 U/L   ALT 30 17 - 63 U/L   Alkaline Phosphatase 55 38 - 126 U/L   Total Bilirubin 0.8 0.3 - 1.2 mg/dL   GFR calc non Af Amer >60 >60 mL/min   GFR calc Af Amer >60 >60 mL/min   Anion gap 9 5 - 15  Urine microscopic-add on  Result Value Ref Range   Squamous Epithelial / LPF FEW (A) RARE   WBC, UA 7-10 <3 WBC/hpf   RBC / HPF 11-20 <3 RBC/hpf   Bacteria, UA FEW (A) RARE   Urine-Other MUCOUS PRESENT    Ct Renal Stone Study 01/18/2015   CLINICAL DATA:  Left flank pain which began at 4:30 a.m. History of renal calculi.  EXAM: CT  ABDOMEN AND PELVIS WITHOUT CONTRAST  TECHNIQUE: Multidetector CT imaging of the abdomen and pelvis was performed following the standard protocol without IV contrast.  COMPARISON:  03/14/2006  FINDINGS: Lower chest: Lung bases are clear.  Hepatobiliary: No focal hepatic lesion. No biliary duct dilatation. Gallbladder is normal. Common bile duct is normal.  Pancreas: Pancreas is normal. No ductal dilatation. No pancreatic inflammation.  Spleen: Normal spleen.  Adrenals/urinary tract: Adrenal glands are normal.  Hydronephrosis and hydroureter of the left  renal collecting system secondary to a 11 mm obstructing calculus at the left ureteropelvic junction. This large calculus is readily evident on the CT topogram.  There are approximately 6 additional calculi within the lower pole of the for left kidney. A single small calculus within the cortex of the right kidney. No ureterolithiasis. No bladder calculi.  Stomach/Bowel: Stomach, small bowel, appendix, and cecum are normal. The colon and rectosigmoid colon are normal.  Vascular/Lymphatic: Abdominal aorta is normal caliber. There is no retroperitoneal or periportal lymphadenopathy. No pelvic lymphadenopathy.  Reproductive: Prostate normal  Musculoskeletal: No aggressive osseous lesion.  Other: No free fluid.  IMPRESSION: 1. Large obstructing calculus at the left ureteropelvic junction with hydronephrosis. 2. Bilateral nephrolithiasis.   Electronically Signed   By: Genevive BiStewart  Edmunds M.D.   On: 01/18/2015 10:12    1230:  Pt continues to c/o pain despite multiple doses of IV pain meds. Will re-medicate, admit. Dx and testing d/w pt and family.  Questions answered.  Verb understanding, agreeable to admit.   T/C to Urology Dr. Patsi Searsannenbaum, case discussed, including:  HPI, pertinent PM/SHx, VS/PE, dx testing, ED course and treatment:  Agreeable to come to ED for evaluation to admit.    Samuel JesterKathleen Nazaiah Navarrete, DO 01/22/15 1423

## 2015-01-18 NOTE — Anesthesia Postprocedure Evaluation (Signed)
  Anesthesia Post-op Note  Patient: [name]  Procedure(s) Performed: Procedure(s) (LRB): CYSTOSCOPY WITH RETROGRADE PYELOGRAM/URETERAL STENT PLACEMENT (Left)  Patient Location: PACU  Anesthesia Type: General  Level of Consciousness: awake and alert   Airway and Oxygen Therapy: Patient Spontanous Breathing  Post-op Pain: mild  Post-op Assessment: Post-op Vital signs reviewed, Patient's Cardiovascular Status Stable, Respiratory Function Stable, Patent Airway and No signs of Nausea or vomiting  Last Vitals:  Filed Vitals:   01/18/15 1555  BP:   Pulse: 56  Temp: 36.9 C  Resp: 12    Post-op Vital Signs: stable   Complications: No apparent anesthesia complications

## 2015-01-19 ENCOUNTER — Encounter (HOSPITAL_COMMUNITY): Payer: Self-pay | Admitting: Urology

## 2015-01-30 ENCOUNTER — Emergency Department (HOSPITAL_COMMUNITY): Payer: Self-pay

## 2015-01-30 ENCOUNTER — Encounter (HOSPITAL_COMMUNITY): Payer: Self-pay

## 2015-01-30 ENCOUNTER — Emergency Department (HOSPITAL_COMMUNITY)
Admission: EM | Admit: 2015-01-30 | Discharge: 2015-01-30 | Disposition: A | Discharge status: Home / Self Care | Payer: Self-pay | Attending: Emergency Medicine | Admitting: Emergency Medicine

## 2015-01-30 DIAGNOSIS — Z720 Tobacco use: Secondary | ICD-10-CM | POA: Insufficient documentation

## 2015-01-30 DIAGNOSIS — Z87442 Personal history of urinary calculi: Secondary | ICD-10-CM | POA: Insufficient documentation

## 2015-01-30 DIAGNOSIS — N3001 Acute cystitis with hematuria: Secondary | ICD-10-CM | POA: Insufficient documentation

## 2015-01-30 DIAGNOSIS — Z79899 Other long term (current) drug therapy: Secondary | ICD-10-CM | POA: Insufficient documentation

## 2015-01-30 LAB — CBC
HEMATOCRIT: 47.9 % (ref 39.0–52.0)
Hemoglobin: 15.8 g/dL (ref 13.0–17.0)
MCH: 31 pg (ref 26.0–34.0)
MCHC: 33 g/dL (ref 30.0–36.0)
MCV: 94.1 fL (ref 78.0–100.0)
Platelets: 238 10*3/uL (ref 150–400)
RBC: 5.09 MIL/uL (ref 4.22–5.81)
RDW: 13.3 % (ref 11.5–15.5)
WBC: 11.3 10*3/uL — AB (ref 4.0–10.5)

## 2015-01-30 LAB — URINALYSIS, ROUTINE W REFLEX MICROSCOPIC
Glucose, UA: NEGATIVE mg/dL
KETONES UR: NEGATIVE mg/dL
Nitrite: POSITIVE — AB
PROTEIN: 100 mg/dL — AB
Specific Gravity, Urine: 1.023 (ref 1.005–1.030)
UROBILINOGEN UA: 1 mg/dL (ref 0.0–1.0)
pH: 5.5 (ref 5.0–8.0)

## 2015-01-30 LAB — URINE MICROSCOPIC-ADD ON

## 2015-01-30 LAB — COMPREHENSIVE METABOLIC PANEL
ALBUMIN: 4.2 g/dL (ref 3.5–5.0)
ALK PHOS: 60 U/L (ref 38–126)
ALT: 33 U/L (ref 17–63)
ANION GAP: 6 (ref 5–15)
AST: 19 U/L (ref 15–41)
BUN: 20 mg/dL (ref 6–20)
CO2: 26 mmol/L (ref 22–32)
Calcium: 9 mg/dL (ref 8.9–10.3)
Chloride: 105 mmol/L (ref 101–111)
Creatinine, Ser: 1.02 mg/dL (ref 0.61–1.24)
GFR calc non Af Amer: >60 mL/min (ref >60)
Glucose, Bld: 113 mg/dL — ABNORMAL HIGH (ref 65–99)
POTASSIUM: 4.2 mmol/L (ref 3.5–5.1)
SODIUM: 137 mmol/L (ref 135–145)
Total Bilirubin: 0.8 mg/dL (ref 0.3–1.2)
Total Protein: 7 g/dL (ref 6.5–8.1)

## 2015-01-30 MED ORDER — CEPHALEXIN 500 MG PO CAPS: 500.0000 mg | ORAL_CAPSULE | Freq: Three times a day (TID) | ORAL | Status: DC

## 2015-01-30 MED ORDER — CEPHALEXIN 500 MG PO CAPS
500.0000 mg | ORAL_CAPSULE | Freq: Once | ORAL | Status: AC
  Administered 2015-01-30: 500 mg via ORAL
  Filled 2015-01-30 (×2): qty 1

## 2015-01-30 NOTE — Discharge Instructions (Signed)

## 2015-01-30 NOTE — Progress Notes (Addendum)
  CM spoke with pt who confirms self pay [facility] resident with no pcp.  CM discussed and provided written information for self pay pcps, discussed the importance of pcp vs EDP services for f/u care, www.needymeds.org, www.goodrx.com, discounted pharmacies and other [employer] such as [employer] , [employer], affordable care act,  [place] med assist, financial assistance, self pay dental services, [place] med assist, DSS and  health department  Reviewed resources for [employer] self pay pcps like [facility], family medicine at [facility] [employer] [employer] [facility], [facility], [facility], local urgent care centers, [facility], [facility] family practice, general medical clinics, family services of the [place], [facility] urgent care plus others, medication resources, [facility] out patient pharmacies and housing Pt voiced understanding and appreciation of resources provided   Provided [facility] contact information  Pt seen by [facility] clinical liason while in [facility] ED

## 2015-01-30 NOTE — ED Provider Notes (Signed)
CSN: [id number]     Arrival date & time 01/30/15  1022 History   First MD Initiated Contact with Patient 01/30/15 1136     Chief Complaint  Patient presents with  . Hematuria  . Testicle Pain     HPI Patient underwent cystoscopy with retrograde pyelogram and ureteral stent placement on 01/18/2015 by Dr. Patsi Sears.  He now presents with ongoing discomfort in his left groin and some dysuria.  He denies fevers and chills.  No nausea vomiting.  Denies left flank pain.  He has never followed up with his urologist after the procedure.   Past Medical History  Diagnosis Date  . Kidney stone    Past Surgical History  Procedure Laterality Date  . Arm surgery    . Cystoscopy w/ ureteral stent placement Left 01/18/2015    Procedure: CYSTOSCOPY WITH RETROGRADE PYELOGRAM/URETERAL STENT PLACEMENT;  Surgeon: Jethro Bolus, MD;  Location: [facility];  Service: Urology;  Laterality: Left;   History reviewed. No pertinent family history. History  Substance Use Topics  . Smoking status: Current Every Day Smoker -- 0.50 packs/day    Types: Cigarettes  . Smokeless tobacco: Never Used  . Alcohol Use: Yes     Comment: occ    Review of Systems  All other systems reviewed and are negative.     Allergies  Review of patient's allergies indicates no known allergies.  Home Medications   Prior to Admission medications   Medication Sig Start Date End Date Taking? Authorizing Provider  phenazopyridine (PYRIDIUM) 200 MG tablet Take 1 tablet (200 mg total) by mouth 3 (three) times daily as needed for pain. 01/18/15  Yes Jethro Bolus, MD  tamsulosin (FLOMAX) 0.4 MG CAPS capsule Take 1 capsule (0.4 mg total) by mouth daily. 01/18/15  Yes Jethro Bolus, MD  traMADol-acetaminophen (ULTRACET) 37.5-325 MG per tablet Take 1 tablet by mouth every 6 (six) hours as needed. 01/18/15  Yes Jethro Bolus, MD  trimethoprim (TRIMPEX) 100 MG tablet Take 1 tablet (100 mg total) by mouth 1 day or 1  dose. Patient taking differently: Take 100 mg by mouth daily.  01/18/15  Yes Jethro Bolus, MD  cephALEXin (KEFLEX) 500 MG capsule Take 1 capsule (500 mg total) by mouth 3 (three) times daily. 01/30/15   Azalia Bilis, MD   BP 119/75 mmHg  Pulse 69  Temp(Src) 98.5 F (36.9 C) (Oral)  Resp 14  SpO2 100% Physical Exam  Constitutional: He is oriented to person, place, and time. He appears well-developed and well-nourished.  HENT:  Head: Normocephalic and atraumatic.  Eyes: EOM are normal.  Neck: Normal range of motion.  Cardiovascular: Normal rate, regular rhythm, normal heart sounds and intact distal pulses.   Pulmonary/Chest: Effort normal and breath sounds normal. No respiratory distress.  Abdominal: Soft. He exhibits no distension. There is no tenderness.  Musculoskeletal: Normal range of motion.  Neurological: He is alert and oriented to person, place, and time.  Skin: Skin is warm and dry.  Psychiatric: He has a normal mood and affect. Judgment normal.  Nursing note and vitals reviewed.   ED Course  Procedures (including critical care time) Labs Review Labs Reviewed  URINALYSIS, ROUTINE W REFLEX MICROSCOPIC (NOT AT [facility]) - Abnormal; Notable for the following:    Color, Urine AMBER (*)    APPearance CLOUDY (*)    Hgb urine dipstick LARGE (*)    Bilirubin Urine SMALL (*)    Protein, ur 100 (*)    Nitrite POSITIVE (*)    Leukocytes,  UA MODERATE (*)    All other components within normal limits  CBC - Abnormal; Notable for the following:    WBC 11.3 (*)    All other components within normal limits  URINE MICROSCOPIC-ADD ON - Abnormal; Notable for the following:    Squamous Epithelial / LPF FEW (*)    Bacteria, UA FEW (*)    All other components within normal limits  COMPREHENSIVE METABOLIC PANEL - Abnormal; Notable for the following:    Glucose, Bld 113 (*)    All other components within normal limits  URINE CULTURE    Imaging Review Ct Abdomen Pelvis Wo  Contrast  01/30/2015   CLINICAL DATA:  Gross hematuria for 12 days  EXAM: CT ABDOMEN AND PELVIS WITHOUT CONTRAST  TECHNIQUE: Multidetector CT imaging of the abdomen and pelvis was performed following the standard protocol without IV contrast.  COMPARISON:  CT 01/18/2015  FINDINGS: Lower chest: Lung bases are clear.  Hepatobiliary: No focal hepatic lesion. No biliary duct dilatation. Gallbladder is normal. Common bile duct is normal.  Pancreas: Pancreas is normal. No ductal dilatation. No pancreatic inflammation.  Spleen: Normal spleen  Adrenals/urinary tract: Adrenal glands are normal.  There is a double-J ureteral stent extending from the left renal pelvis to the bladder. Some relief of the left hydronephrosis seen on prior. Mild lower pole hydronephrosis remains. A large calculus positioned at the left ureteropelvic junction has moved more centrally in the pelvis and measures 10 mm on image 36, series 2. There are multiple 2-4 mm calculi in lower pole of the left kidney not changed.  Tiny 1 mm cortical calcification in the right kidney. No right ureterolithiasis. No bladder calculi.  Stomach/Bowel: Stomach, small bowel, appendix, and cecum are normal. The colon and rectosigmoid colon are normal.  Vascular/Lymphatic: Abdominal aorta is normal caliber. There is no retroperitoneal or periportal lymphadenopathy. No pelvic lymphadenopathy.  Reproductive: Prostate gland is normal.  Musculoskeletal: No aggressive osseous lesion.  Other: No free fluid.  IMPRESSION: 1. Interval placement of double-J left ureteral stent with some relief of the left hydronephrosis. 2. Large left renal calculus remains within the left renal pelvis. 3. Multiple calcification lower pole of the left kidney not changed.   Electronically Signed   By: Genevive Bi M.D.   On: 01/30/2015 15:03  I personally reviewed the imaging tests through PACS system I reviewed available ER/hospitalization records through the EMR    EKG  Interpretation None      MDM   Final diagnoses:  Acute cystitis with hematuria    CT scan without acute pathology.  Some of his discomfort and pain may be coming from ureteral colic from his stent.  He also appears to have urinary tract infection.  Patient be placed on antibiotics.  Urine culture sent.  Patient is instructed to follow-up with his urologist.  Contact information given.  Patient understands return to the emergency department for new or worsening symptoms.  Vital signs are normal.    Azalia Bilis, MD 01/30/15 [phone]

## 2015-01-30 NOTE — ED Notes (Signed)
Informed the pt a urine specimen is needed. 

## 2015-01-30 NOTE — ED Notes (Signed)
Pt c/o hematuria x 12 days and L testicle pain x months.  Pain score 4/10.  Pt reports have kidney stones removed x 12 days ago.  Sts movement makes bleeding a pain worse.  Pt reports that he did not mention testicle pain to Urologist and has not followed up since surgery.

## 2015-02-01 LAB — URINE CULTURE: CULTURE: NO GROWTH

## 2015-03-21 ENCOUNTER — Emergency Department (HOSPITAL_COMMUNITY): Payer: Self-pay

## 2015-03-21 ENCOUNTER — Emergency Department (HOSPITAL_COMMUNITY)
Admission: EM | Admit: 2015-03-21 | Discharge: 2015-03-21 | Disposition: A | Discharge status: Home / Self Care | Payer: Self-pay | Attending: Emergency Medicine | Admitting: Emergency Medicine

## 2015-03-21 ENCOUNTER — Encounter (HOSPITAL_COMMUNITY): Payer: Self-pay

## 2015-03-21 DIAGNOSIS — Z720 Tobacco use: Secondary | ICD-10-CM | POA: Insufficient documentation

## 2015-03-21 DIAGNOSIS — IMO0002 Reserved for concepts with insufficient information to code with codable children: Secondary | ICD-10-CM

## 2015-03-21 DIAGNOSIS — Z466 Encounter for fitting and adjustment of urinary device: Secondary | ICD-10-CM | POA: Insufficient documentation

## 2015-03-21 DIAGNOSIS — Z87442 Personal history of urinary calculi: Secondary | ICD-10-CM | POA: Insufficient documentation

## 2015-03-21 NOTE — Discharge Instructions (Signed)
Ureteral Stent Implantation °Ureteral stent implantation is the implantation of a soft plastic tube with multiple holes into the tube that drains urine from your kidney to your bladder (ureter). The stent helps drain your kidney when there is a blockage of the flow of urine in your ureter. The stent has a coil on each end to keep it from falling out. One end stays in the kidney. The other end stays in the bladder. It is most often taken out after any blockage has been removed or your ureter has healed. Short-term stents have a string attached to make removal quite easy. Removal of a short-term stent can be done in your health care provider's office or by you at home. Long-term stents need to be changed every few months. °LET YOUR HEALTH CARE PROVIDER KNOW ABOUT: °· Any allergies you have. °· All medicines you are taking, including vitamins, herbs, eye drops, creams, and over-the-counter medicines. °· Previous problems you or members of your family have had with the use of anesthetics. °· Any blood disorders you have. °· Previous surgeries you have had. °· Medical conditions you have. °RISKS AND COMPLICATIONS °Generally, ureteral stent implantation is a safe procedure. However, as with any procedure, complications can occur. Possible complications include: °· Movement of the stent away from where it was originally placed (migration). This may affect the ability of the stent to properly drain your kidney. If migration of the stent occurs, the stent may need to be replaced or repositioned. °· Perforation of the ureter.  °· Infection. °BEFORE THE PROCEDURE °· You may be asked to wash your genital area with sterile soap the morning of your procedure. °· You may be given an oral antibiotic which you should take with a sip of water as prescribed by your health care provider. °· You may be asked to not eat or drink for 8 hours before the surgery. °PROCEDURE °· First you will be given an anesthetic so you do not feel pain  during the procedure. °· Your health care provider will insert a special lighted instrument called a cystoscope into your bladder. This allows your health care provider to see the opening to your ureter. °· A thin wire is carefully threaded into your bladder and up the ureter. The stent is inserted over the wire and the wire is then removed. °· Your bladder will be emptied of urine. °AFTER THE PROCEDURE °You will be taken to a recovery room until it is okay for you to go home. °Document Released: 07/29/2000 Document Revised: 08/06/2013 Document Reviewed: 01/08/2013 °[employer]® Patient Information ©2015 [employer]. This information is not intended to replace advice given to you by your health care provider. Make sure you discuss any questions you have with your health care provider. ° °

## 2015-03-21 NOTE — ED Notes (Addendum)
Pt presents needing a cysto stent removed and dysuria.  Pt reports having the stent placed 6/5.  Pt has been unable to follow-up w/ Urology.

## 2015-03-21 NOTE — ED Provider Notes (Signed)
CSN: [id number]     Arrival date & time 03/21/15  1019 History   First MD Initiated Contact with Patient 03/21/15 1032     Chief Complaint  Patient presents with  . Cysto Stent Removal  . Dysuria     (Consider location/radiation/quality/duration/timing/severity/associated sxs/prior Treatment) Patient is a 34 y.o. male presenting with male genitourinary complaint. The history is provided by the patient.  Male GU Problem Presenting symptoms comment:  Stent left in place after lost to follow up Context comment:  Following stent placement for obstructing stone Relieved by:  Nothing Worsened by:  Nothing tried Ineffective treatments:  None tried Associated symptoms: no abdominal pain, no fever, no nausea, no urinary frequency, no urinary hesitation, no urinary incontinence and no urinary retention   Risk factors: no urinary catheter     Past Medical History  Diagnosis Date  . Kidney stone    Past Surgical History  Procedure Laterality Date  . Arm surgery    . Cystoscopy w/ ureteral stent placement Left 01/18/2015    Procedure: CYSTOSCOPY WITH RETROGRADE PYELOGRAM/URETERAL STENT PLACEMENT;  Surgeon: Jethro Bolus, MD;  Location: [facility];  Service: Urology;  Laterality: Left;   History reviewed. No pertinent family history. History  Substance Use Topics  . Smoking status: Current Every Day Smoker -- 0.50 packs/day    Types: Cigarettes  . Smokeless tobacco: Never Used  . Alcohol Use: Yes     Comment: occ    Review of Systems  Constitutional: Negative for fever.  Gastrointestinal: Negative for nausea and abdominal pain.  Genitourinary: Negative for bladder incontinence, hesitancy and frequency.  All other systems reviewed and are negative.     Allergies  Review of patient's allergies indicates no known allergies.  Home Medications   Prior to Admission medications   Medication Sig Start Date End Date Taking? Authorizing Provider  cephALEXin (KEFLEX) 500 MG capsule  Take 1 capsule (500 mg total) by mouth 3 (three) times daily. Patient not taking: Reported on 03/21/2015 01/30/15   Azalia Bilis, MD  phenazopyridine (PYRIDIUM) 200 MG tablet Take 1 tablet (200 mg total) by mouth 3 (three) times daily as needed for pain. Patient not taking: Reported on 03/21/2015 01/18/15   Jethro Bolus, MD  tamsulosin (FLOMAX) 0.4 MG CAPS capsule Take 1 capsule (0.4 mg total) by mouth daily. Patient not taking: Reported on 03/21/2015 01/18/15   Jethro Bolus, MD  traMADol-acetaminophen (ULTRACET) 37.5-325 MG per tablet Take 1 tablet by mouth every 6 (six) hours as needed. Patient not taking: Reported on 03/21/2015 01/18/15   Jethro Bolus, MD  trimethoprim (TRIMPEX) 100 MG tablet Take 1 tablet (100 mg total) by mouth 1 day or 1 dose. Patient not taking: Reported on 03/21/2015 01/18/15   Jethro Bolus, MD   BP 137/89 mmHg  Pulse 94  Temp(Src) 98 F (36.7 C) (Oral)  Resp 16  SpO2 99% Physical Exam  Constitutional: He is oriented to person, place, and time. He appears well-developed and well-nourished. No distress.  HENT:  Head: Normocephalic and atraumatic.  Eyes: Conjunctivae are normal.  Neck: Neck supple. No tracheal deviation present.  Cardiovascular: Normal rate and regular rhythm.   Pulmonary/Chest: Effort normal. No respiratory distress.  Abdominal: Soft. He exhibits no distension. There is no tenderness.  Neurological: He is alert and oriented to person, place, and time.  Skin: Skin is warm and dry.  Psychiatric: He has a normal mood and affect.    ED Course  Procedures (including critical care time) Labs Review Labs Reviewed -  No data to display  Imaging Review Dg Abd 1 View  03/21/2015   CLINICAL DATA:  Ureteral stent placement 2 months ago. Hematuria with left groin discomfort and dysuria. Subsequent encounter.  EXAM: ABDOMEN - 1 VIEW  COMPARISON:  CT 01/30/2015.  FINDINGS: Double-J left ureteral stent appears stable in position. The previously  demonstrated dominant 12 mm calculus in the left interpolar infundibulum has now moved medially, overlapping the superior aspect of the stent, likely in the renal pelvis. No stone fragments are seen along the course of the stent. There are stable small calculi in the lower pole of the left kidney. The left kidney appears enlarged. Small pelvic phleboliths are unchanged. The bowel gas pattern is normal.  IMPRESSION: 1. The left ureteral stent is unchanged in position. 2. Dominant left renal calculus now overlaps the intrapelvic portion of the renal stent. 3. Possible interval enlargement of the left kidney as could be seen with stent dysfunction/ureteral obstruction.   Electronically Signed   By: Carey Bullocks M.D.   On: 03/21/2015 11:29   I independently viewed and interpreted the above radiology studies and agree with radiologist report.   EKG Interpretation None      MDM   Final diagnoses:  History of ureter stent   Pt presenting with request to have stent removed from previous intervention on large ureterolithiasis 2 months ago. Was unable to get appt with Dr Patsi Sears until September after not attending scheduled f/u. XR ordered showed no signs of migration. No s/s of obstruction.  I spoke with Dr.lyons of urology who agreed to inform Dr. Patsi Sears about this case and he recommended the patient call the clinic on Monday to get into the office as soon as possible.    Lyndal Pulley, MD 03/21/15 [phone]

## 2015-04-15 ENCOUNTER — Other Ambulatory Visit: Payer: Self-pay | Admitting: Urology

## 2015-04-30 ENCOUNTER — Encounter (HOSPITAL_COMMUNITY): Payer: Self-pay | Admitting: *Deleted

## 2015-05-05 NOTE — H&P (Signed)
Reason For Visit Discuss ESWL   Active Problems Problems  1. Obstruction of left ureteropelvic junction (UPJ) due to stone (N20.1)  History of Present Illness 34 yo male returns today for f/u to discuss ESWL. He was initially seen in the ER on 01/18/15 for Lt flank pain associated with N/V. CT showed an 11mm Lt impacted UPJ stone. He is s/p cysto/Lt RPG/Lt JJ stent on 01/18/15. He is having some dysuria, frequency and "bleeding". 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   Past Medical History Problems  1. History of Anxiety (F41.9) 2. History of asthma (Z87.09) 3. History of depression (Z86.59) 4. History of esophageal reflux (Z87.19)  Surgical History Problems  1. History of Cystoscopy With Insertion Of Ureteral Stent Left  Current Meds 1. No Medications;  Therapy: (Recorded:11Aug2016) to Recorded  Allergies Medication  1. No Known Drug Allergies  Family History Problems  1. No pertinent family history : Father  Social History Problems  1. Alcohol use (Z78.9) 2. Caffeine use (F15.90) 3. Never a smoker 4. Single  Review of Systems Genitourinary, constitutional, skin, eye, otolaryngeal, hematologic/lymphatic, cardiovascular, pulmonary, endocrine, musculoskeletal, gastrointestinal, neurological and psychiatric system(s) were reviewed and pertinent findings if present are noted and are otherwise negative.  Gastrointestinal: flank pain.    Vitals Vital Signs [Data Includes: Last 1 Day]  Recorded: 11Aug2016 02:22PM  Height: 5 ft 10 in Weight: 170 lb  BMI Calculated: 24.39 BSA Calculated: 1.95 Blood Pressure: 127 / 84 Temperature: 98.5 F Heart Rate: 83  Physical Exam Constitutional: Well nourished and well developed . No acute distress.  ENT:. The ears and nose are normal in appearance.  Neck: The appearance of the neck is normal and no neck mass is present.  Pulmonary: No respiratory distress and normal respiratory rhythm  and effort.  Cardiovascular: Heart rate and rhythm are normal . No peripheral edema.  Abdomen: The abdomen is soft and nontender. No masses are palpated. No CVA tenderness. No hernias are palpable. No hepatosplenomegaly noted.  Genitourinary: Examination of the penis demonstrates no discharge, no masses, no lesions and a normal meatus. The scrotum is without lesions. The right epididymis is palpably normal and non-tender. The left epididymis is palpably normal and non-tender. The right testis is non-tender and without masses. The left testis is non-tender and without masses.  Lymphatics: The femoral and inguinal nodes are not enlarged or tender.  Skin: Normal skin turgor, no visible rash and no visible skin lesions.  Neuro/Psych:. Mood and affect are appropriate.    Results/Data Urine [Data Includes: Last 1 Day]   11Aug2016  COLOR AMBER   APPEARANCE CLOUDY   SPECIFIC GRAVITY 1.020   pH 6.0   GLUCOSE NEGATIVE   BILIRUBIN NEGATIVE   KETONE NEGATIVE   BLOOD 3+   PROTEIN 2+   NITRITE NEGATIVE   LEUKOCYTE ESTERASE 2+   SQUAMOUS EPITHELIAL/HPF 0-5 HPF  WBC 40-60 WBC/HPF  RBC >60 RBC/HPF  BACTERIA FEW HPF  CRYSTALS See Below HPF  CASTS NONE SEEN LPF  Other SN   Yeast NONE SEEN HPF   Procedure KUB: Comparison with prior CT and KUB: Stone trapped in JJ stent, and LLP stone. Gas pattern normal. Bones normal.     Assessment Assessed  1. Obstruction of left ureteropelvic junction (UPJ) due to stone (N20.1)  11mm Left UPJ stone , now dislodged proximally, trapped in JJ stent, with some LLP fragnments. We have discussed the stone, and we have reviewed his KUB  from today, and his CT from the ER. We have measured his stone to skin length at 93mm, and his stone density at 279 HU. He is a good candidate for lithotripsy and we have discussed this as a good option for him. Also we have discussed ureteroscopy and laser of stone, and also percutaneous ultrasonic stone extraction.    Pt does not  want medication-he thinks it is a waste of $$. He is given trimethoprim, however , until his lithotripsy.   Plan Health Maintenance  1. UA With REFLEX; [Do Not Release]; Status:Resulted - Requires Verification;   Done:  11Aug2016 01:56PM Obstruction of left ureteropelvic junction (UPJ) due to stone  2. Start: Trimethoprim 100 MG Oral Tablet; Take 1 tablet daily 3. KUB; Status:Resulted - Requires Verification;   Done: 11Aug2016 02:04PM  lithotripsy of Left renal pelvic stone.   RTC for JJ removal.   Signatures Electronically signed by : Jethro Bolus, M.D.; Mar 26 2015  3:16PM EST

## 2015-05-07 ENCOUNTER — Ambulatory Visit (HOSPITAL_COMMUNITY)
Admission: RE | Admit: 2015-05-07 | Discharge: 2015-05-07 | Disposition: A | Discharge status: Home / Self Care | Payer: Self-pay | Source: Ambulatory Visit | Attending: Urology | Admitting: Urology

## 2015-05-07 ENCOUNTER — Encounter (HOSPITAL_COMMUNITY)
Admission: RE | Disposition: A | Discharge status: Home / Self Care | Payer: Self-pay | Source: Ambulatory Visit | Attending: Urology

## 2015-05-07 ENCOUNTER — Encounter (HOSPITAL_COMMUNITY): Payer: Self-pay | Admitting: *Deleted

## 2015-05-07 ENCOUNTER — Ambulatory Visit (HOSPITAL_COMMUNITY): Payer: Self-pay

## 2015-05-07 DIAGNOSIS — K219 Gastro-esophageal reflux disease without esophagitis: Secondary | ICD-10-CM | POA: Insufficient documentation

## 2015-05-07 DIAGNOSIS — N200 Calculus of kidney: Secondary | ICD-10-CM

## 2015-05-07 DIAGNOSIS — J45909 Unspecified asthma, uncomplicated: Secondary | ICD-10-CM | POA: Insufficient documentation

## 2015-05-07 DIAGNOSIS — N132 Hydronephrosis with renal and ureteral calculous obstruction: Secondary | ICD-10-CM | POA: Insufficient documentation

## 2015-05-07 SURGERY — LITHOTRIPSY, ESWL
Anesthesia: LOCAL | Laterality: Left

## 2015-05-07 MED ORDER — CIPROFLOXACIN HCL 500 MG PO TABS
500.0000 mg | ORAL_TABLET | ORAL | Status: AC
  Administered 2015-05-07: 500 mg via ORAL
  Filled 2015-05-07: qty 1

## 2015-05-07 MED ORDER — DIAZEPAM 5 MG PO TABS
10.0000 mg | ORAL_TABLET | ORAL | Status: AC
  Administered 2015-05-07: 10 mg via ORAL
  Filled 2015-05-07: qty 2

## 2015-05-07 MED ORDER — SODIUM CHLORIDE 0.9 % IV SOLN
INTRAVENOUS | Status: DC
  Administered 2015-05-07: 10:00:00 via INTRAVENOUS

## 2015-05-07 MED ORDER — DIPHENHYDRAMINE HCL 25 MG PO CAPS
25.0000 mg | ORAL_CAPSULE | ORAL | Status: AC
  Administered 2015-05-07: 25 mg via ORAL
  Filled 2015-05-07: qty 1

## 2015-05-07 MED ORDER — CIPROFLOXACIN HCL 500 MG PO TABS: 500.0000 mg | ORAL_TABLET | Freq: Two times a day (BID) | ORAL | Status: AC

## 2015-05-07 NOTE — Op Note (Signed)
Left UPJ stone with prior left ureteral stent  Left ESWL  Stone fragmented well.

## 2015-05-07 NOTE — Interval H&P Note (Signed)
History and Physical Interval Note:  05/07/2015 11:04 AM  Georgina Snell  has presented today for surgery, with the diagnosis of LEFT RENAL PELVIC STONE  The various methods of treatment have been discussed with the patient and family. After consideration of risks, benefits and other options for treatment, the patient has consented to  Procedure(s): LEFT EXTRACORPOREAL SHOCK WAVE LITHOTRIPSY (ESWL) (Left) as a surgical intervention .  The patient's history has been reviewed, patient examined, no change in status, stable for surgery.  I have reviewed the patient's chart and labs. New H&P completed in mobile litho unit.  Questions were answered to the patient's satisfaction.     ESKRIDGE, MATTHEW

## 2015-05-07 NOTE — Progress Notes (Signed)
I confirmed with the office/Ruth that patient has appt Sep 27 for stent removal. Discussed with patient pre-op and father post-op importance of f/u for stent removal. Father wanted some guarantee stent would be removed and that another f/u wouldn't be needed.  I told him that is the purpose of the f/u appt - check KUB, likely remove the stent. I told him the stone fragmented well and I was hopeful Dr. Patsi Sears would be able to remove the stent, but I couldn't make any guarantees. We discussed patients sometimes need multiple procedures to clear stone. Again, discussed importance of f/u and stent removal. Pt has an appt and I recommended he keep it.

## 2015-05-07 NOTE — Discharge Instructions (Signed)
